# Patient Record
Sex: Female | Born: 1970 | Race: White | Hispanic: No | Marital: Married | Smoking: Never smoker
Health system: Southern US, Community
[De-identification: ages and names within clinical notes are randomized; demographics above are authoritative.]

## PROBLEM LIST (undated history)

## (undated) DIAGNOSIS — F329 Major depressive disorder, single episode, unspecified: Secondary | ICD-10-CM

## (undated) DIAGNOSIS — F32A Depression, unspecified: Secondary | ICD-10-CM

## (undated) DIAGNOSIS — I5181 Takotsubo syndrome: Secondary | ICD-10-CM

## (undated) DIAGNOSIS — F419 Anxiety disorder, unspecified: Secondary | ICD-10-CM

## (undated) HISTORY — PX: DILATION AND CURETTAGE OF UTERUS: SHX78

## (undated) HISTORY — PX: CARDIAC SURGERY: SHX584

---

## 2009-05-16 ENCOUNTER — Ambulatory Visit: Payer: Self-pay | Admitting: Family Medicine

## 2009-05-16 DIAGNOSIS — F329 Major depressive disorder, single episode, unspecified: Secondary | ICD-10-CM

## 2012-01-12 ENCOUNTER — Emergency Department (INDEPENDENT_AMBULATORY_CARE_PROVIDER_SITE_OTHER)
Admission: EM | Admit: 2012-01-12 | Discharge: 2012-01-12 | Disposition: A | Discharge status: Home / Self Care | Payer: Managed Care, Other (non HMO) | Source: Home / Self Care | Attending: Family Medicine | Admitting: Family Medicine

## 2012-01-12 DIAGNOSIS — H6120 Impacted cerumen, unspecified ear: Secondary | ICD-10-CM

## 2012-01-12 DIAGNOSIS — H6123 Impacted cerumen, bilateral: Secondary | ICD-10-CM

## 2012-01-12 DIAGNOSIS — J310 Chronic rhinitis: Secondary | ICD-10-CM

## 2012-01-12 HISTORY — DX: Major depressive disorder, single episode, unspecified: F32.9

## 2012-01-12 HISTORY — DX: Depression, unspecified: F32.A

## 2012-01-12 MED ORDER — CEFDINIR 300 MG PO CAPS: 300.0000 mg | ORAL_CAPSULE | Freq: Two times a day (BID) | ORAL | Status: AC

## 2012-01-12 NOTE — Discharge Instructions (Signed)
Take plain Mucinex (guaifenesin) twice daily for cough and congestion.  Add Sudafed in mornings. Increase fluid intake. May use Afrin nasal spray (or generic oxymetazoline) twice daily for about 5 days.  Also recommend using saline nasal spray several times daily and saline nasal irrigation (AYR is a common brand).  Use prescription nose spray after Afrin and saline irrigation Stop all antihistamines for now, and other non-prescription cough/cold preparations. Begin Cefdinir (Omnicef) if not improving about 5 days or if persistent fever develops.

## 2012-01-12 NOTE — ED Notes (Signed)
Patient complains of bilateral ear pain and itching in ears for a couple of weeks. For the last few days she has had sneezing, pressure in her face and congestion. Denies fever, chills, sweats, nausea, vomiting or diarrhea.

## 2012-01-12 NOTE — ED Provider Notes (Signed)
History     CSN: [id number]  Arrival date & time 01/12/12  1621   First MD Initiated Contact with Patient 01/12/12 1713      Chief Complaint  Patient presents with  . Otalgia    couple weeks  . Nasal Congestion    few days      HPI Comments: Patient complains of bilateral mild earache and sensation of ears being clogged for about one week.  Both ears have been itching.  She has had increased sinus congestion and sinus pressure for about 3 to 4 days.  She has had no improvement with Sudafed and Claritin.  No sore throat or cough.  No fever.  The history is provided by the patient.    Past Medical History  Diagnosis Date  . Depression     Past Surgical History  Procedure Date  . Dilation and curettage of uterus     1996    Family History  Problem Relation Age of Onset  . Cancer Mother     lung  . Hypertension Father   . Asthma Brother   . Heart failure Other     History  Substance Use Topics  . Smoking status: Never Smoker   . Smokeless tobacco: Never Used  . Alcohol Use: 1.2 oz/week    2 Glasses of wine per week    OB History    Grav Para Term Preterm Abortions TAB SAB Ect Mult Living                  Review of Systems No sore throat No cough No pleuritic pain No wheezing + nasal congestion + post-nasal drainage + sinus pain/pressure No itchy/red eyes + earache No hemoptysis No SOB No fever/ ? chills No nausea No vomiting No abdominal pain No diarrhea No urinary symptoms No skin rashes + fatigue No myalgias + headache Used OTC meds without relief  Allergies  Penicillins  Home Medications   Current Outpatient Rx  Name Route Sig Dispense Refill  . ESCITALOPRAM OXALATE 10 MG PO TABS Oral Take 10 mg by mouth daily.    Marland Kitchen CEFDINIR 300 MG PO CAPS Oral Take 1 capsule (300 mg total) by mouth 2 (two) times daily. (Rx void after 01/20/12) 20 capsule 0    BP 121/66  Pulse 64  Temp 98.4 F (36.9 C) (Oral)  Resp 18  Ht 5\' 4"  (1.626 m)   Wt 115 lb (52.164 kg)  BMI 19.74 kg/m2  SpO2 100%  LMP 12/29/2011  Physical Exam Nursing notes and Vital Signs reviewed. Appearance:  Patient appears healthy, stated age, and in no acute distress Eyes:  Pupils are equal, round, and reactive to light and accomodation.  Extraocular movement is intact.  Conjunctivae are not inflamed  Ears:  Canals are occluded with cerumen bilaterally. Nose:  Mildly congested turbinates.  Mild maxillary sinus tenderness is present.  Pharynx:  Normal Neck:  Supple.  Slightly tender shotty posterior nodes are palpated bilaterally  Lungs:  Clear to auscultation.  Breath sounds are equal.  Heart:  Regular rate and rhythm without murmurs, rubs, or gallops.  Abdomen:  Nontender without masses or hepatosplenomegaly.  Bowel sounds are present.  No CVA or flank tenderness.  Extremities:  No edema.  No calf tenderness Skin:  No rash present.   ED Course  Procedures Bilateral ear lavage.  Post lavage, both canals and tympanic membranes appear normal.      1. Bilateral impacted cerumen   2. Rhinitis; possibly an  early viral URI       MDM  There is no evidence of bacterial infection today.   Treat symptomatically for now: Take plain Mucinex (guaifenesin) twice daily for cough and congestion.  Add Sudafed in mornings. Increase fluid intake. May use Afrin nasal spray (or generic oxymetazoline) twice daily for about 5 days.  Also recommend using saline nasal spray several times daily and saline nasal irrigation (AYR is a common brand).  Use prescription nose spray after Afrin and saline irrigation Stop all antihistamines for now, and other non-prescription cough/cold preparations. Begin Cefdinir (Omnicef) if not improving about 5 days or if persistent fever develops (Given a prescription to hold, with an expiration date)  Followup with ENT if not improving.        Lattie Haw, MD 01/12/12 [phone]

## 2012-07-23 ENCOUNTER — Encounter: Payer: Self-pay | Admitting: Emergency Medicine

## 2012-07-23 ENCOUNTER — Emergency Department (INDEPENDENT_AMBULATORY_CARE_PROVIDER_SITE_OTHER)
Admission: EM | Admit: 2012-07-23 | Discharge: 2012-07-23 | Disposition: A | Discharge status: Home / Self Care | Payer: Managed Care, Other (non HMO) | Source: Home / Self Care | Attending: Family Medicine | Admitting: Family Medicine

## 2012-07-23 DIAGNOSIS — J069 Acute upper respiratory infection, unspecified: Secondary | ICD-10-CM

## 2012-07-23 MED ORDER — BENZONATATE 200 MG PO CAPS: 200.0000 mg | ORAL_CAPSULE | Freq: Every day | ORAL | Status: DC

## 2012-07-23 MED ORDER — DOXYCYCLINE HYCLATE 100 MG PO CAPS: 100.0000 mg | ORAL_CAPSULE | Freq: Two times a day (BID) | ORAL | Status: DC

## 2012-07-23 NOTE — ED Notes (Signed)
Reports cough x 3 days leading to raw throat, aches, low grade fever, congestion; no OTC since last night. Did have Flu vaccination this season.

## 2012-07-23 NOTE — ED Provider Notes (Signed)
History     CSN: [id number]  Arrival date & time 07/23/12  1101   First MD Initiated Contact with Patient 07/23/12 1121      Chief Complaint  Patient presents with  . Cough  . Headache  . Fever     HPI Comments: Patient complains of approximately 3 day history of gradually progressive URI symptoms beginning with a non-productive cough, mild sore throat (now improved), followed by nasal congestion. Complains of fatigue and initial myalgias.  Cough is now worse at night and generally non-productive during the day.  There has been no pleuritic pain, shortness of breath, or wheezes.   The history is provided by the patient.    Past Medical History  Diagnosis Date  . Depression     Past Surgical History  Procedure Date  . Dilation and curettage of uterus     1996    Family History  Problem Relation Age of Onset  . Cancer Mother     lung  . Hypertension Father   . Asthma Brother   . Heart failure Other     History  Substance Use Topics  . Smoking status: Never Smoker   . Smokeless tobacco: Never Used  . Alcohol Use: 1.2 oz/week    2 Glasses of wine per week    OB History    Grav Para Term Preterm Abortions TAB SAB Ect Mult Living                  Review of Systems + mild sore throat + cough No pleuritic pain No wheezing + nasal congestion + post-nasal drainage No sinus pain/pressure No itchy/red eyes No earache No hemoptysis No SOB + low grade fever, + chills No nausea No vomiting No abdominal pain No diarrhea No urinary symptoms No skin rashes + fatigue + myalgias + headache Used OTC meds without relief  Allergies  Penicillins  Home Medications   Current Outpatient Rx  Name  Route  Sig  Dispense  Refill  . BENZONATATE 200 MG PO CAPS   Oral   Take 1 capsule (200 mg total) by mouth at bedtime. Take as needed for cough   12 capsule   0   . DOXYCYCLINE HYCLATE 100 MG PO CAPS   Oral   Take 1 capsule (100 mg total) by mouth 2 (two)  times daily. (Rx void after 07/31/12)   20 capsule   0   . ESCITALOPRAM OXALATE 10 MG PO TABS   Oral   Take 10 mg by mouth daily.           BP 124/76  Temp 98.8 F (37.1 C) (Oral)  Resp 16  Ht 5\' 7"  (1.702 m)  Wt 117 lb (53.071 kg)  BMI 18.32 kg/m2  SpO2 100%  LMP 07/22/2012  Physical Exam Nursing notes and Vital Signs reviewed. Appearance:  Patient appears healthy, stated age, and in no acute distress Eyes:  Pupils are equal, round, and reactive to light and accomodation.  Extraocular movement is intact.  Conjunctivae are not inflamed  Ears:  Canals normal.  Tympanic membranes normal.  Nose:  Mildly congested turbinates.  No sinus tenderness.    Pharynx:  Normal Neck:  Supple.  Slightly tender shotty posterior nodes are palpated bilaterally  Lungs:  Clear to auscultation.  Breath sounds are equal.  Heart:  Regular rate and rhythm without murmurs, rubs, or gallops.  Abdomen:  Nontender without masses or hepatosplenomegaly.  Bowel sounds are present.  No CVA or  flank tenderness.  Extremities:  No edema.  No calf tenderness Skin:  No rash present.   ED Course  Procedures  none      1. Acute upper respiratory infections of unspecified site       MDM  There is no evidence of bacterial infection today.   Treat symptomatically for now  Prescription written for Benzonatate (Tessalon) to take at bedtime for night-time cough.  Take plain Mucinex (guaifenesin) twice daily for cough and congestion.  May add Sudafed as needed.   Increase fluid intake, rest. May use Afrin nasal spray (or generic oxymetazoline) twice daily for about 5 days.  Also recommend using saline nasal spray several times daily and saline nasal irrigation (AYR is a common brand) Stop all antihistamines for now, and other non-prescription cough/cold preparations. May take Ibuprofen 200mg , 4 tabs every 8 hours with food for body aches, fever, etc. Begin doxycycline if not improving about one week or if  persistent fever develops (Given a prescription to hold, with an expiration date)  Follow-up with family doctor if not improving10 days.         Lattie Haw, MD 07/23/12 [phone]

## 2012-07-24 ENCOUNTER — Telehealth: Payer: Self-pay | Admitting: *Deleted

## 2013-02-10 ENCOUNTER — Emergency Department (INDEPENDENT_AMBULATORY_CARE_PROVIDER_SITE_OTHER)
Admission: EM | Admit: 2013-02-10 | Discharge: 2013-02-10 | Disposition: A | Discharge status: Home / Self Care | Payer: Managed Care, Other (non HMO) | Source: Home / Self Care | Attending: Family Medicine | Admitting: Family Medicine

## 2013-02-10 DIAGNOSIS — J069 Acute upper respiratory infection, unspecified: Secondary | ICD-10-CM

## 2013-02-10 DIAGNOSIS — J40 Bronchitis, not specified as acute or chronic: Secondary | ICD-10-CM

## 2013-02-10 HISTORY — DX: Anxiety disorder, unspecified: F41.9

## 2013-02-10 MED ORDER — HYDROCOD POLST-CHLORPHEN POLST 10-8 MG/5ML PO LQCR: 5.0000 mL | Freq: Two times a day (BID) | ORAL | Status: DC | PRN

## 2013-02-10 MED ORDER — AZITHROMYCIN 250 MG PO TABS: ORAL_TABLET | ORAL | Status: DC

## 2013-02-10 NOTE — ED Provider Notes (Signed)
History    CSN: [id number] Arrival date & time 02/10/13  1244  First MD Initiated Contact with Patient 02/10/13 1246     Chief Complaint  Patient presents with  . Facial Pain  . Nasal Congestion    HPI  URI Symptoms Onset: 2 weeks  Description: rhinorrhea, nasal congestion, cough Modifying factors:  non  Symptoms Nasal discharge: yes Fever: no Sore throat: no Cough: yes Wheezing: no Ear pain: no GI symptoms: no Sick contacts: yes  Red Flags  Stiff neck: no Dyspnea: no Rash: no Swallowing difficulty: no  Sinusitis Risk Factors Headache/face pain: n Double sickening: no tooth pain: no  Allergy Risk Factors Sneezing: no Itchy scratchy throat: no Seasonal symptoms: yes  Flu Risk Factors Headache: no muscle aches: no severe fatigue: no   Past Medical History  Diagnosis Date  . Depression   . Anxiety    Past Surgical History  Procedure Laterality Date  . Dilation and curettage of uterus      1996   Family History  Problem Relation Age of Onset  . Cancer Mother     lung  . Hypertension Father   . Asthma Brother   . Heart failure Other    History  Substance Use Topics  . Smoking status: Never Smoker   . Smokeless tobacco: Never Used  . Alcohol Use: 1.2 oz/week    2 Glasses of wine per week   OB History   Grav Para Term Preterm Abortions TAB SAB Ect Mult Living                 Review of Systems  All other systems reviewed and are negative.    Allergies  Penicillins  Home Medications   Current Outpatient Rx  Name  Route  Sig  Dispense  Refill  . azithromycin (ZITHROMAX) 250 MG tablet      Take 2 tabs PO x 1 dose, then 1 tab PO QD x 4 days   6 tablet   0   . benzonatate (TESSALON) 200 MG capsule   Oral   Take 1 capsule (200 mg total) by mouth at bedtime. Take as needed for cough   12 capsule   0   . chlorpheniramine-HYDROcodone (TUSSIONEX PENNKINETIC ER) 10-8 MG/5ML LQCR   Oral   Take 5 mLs by mouth every 12 (twelve)  hours as needed (cough).   60 mL   0   . doxycycline (VIBRAMYCIN) 100 MG capsule   Oral   Take 1 capsule (100 mg total) by mouth 2 (two) times daily. (Rx void after 07/31/12)   20 capsule   0   . escitalopram (LEXAPRO) 10 MG tablet   Oral   Take 10 mg by mouth daily.          BP 121/77  Pulse 72  Temp(Src) 98.2 F (36.8 C) (Oral)  Ht 5\' 7"  (1.702 m)  Wt 117 lb (53.071 kg)  BMI 18.32 kg/m2  SpO2 100%  LMP 02/09/2013 Physical Exam  Constitutional: She appears well-developed and well-nourished.  HENT:  Head: Normocephalic and atraumatic.  Right Ear: External ear normal.  Left Ear: External ear normal.  +nasal erythema, rhinorrhea bilaterally, + post oropharyngeal erythema    Eyes: Conjunctivae are normal. Pupils are equal, round, and reactive to light.  Neck: Normal range of motion. Neck supple.  Cardiovascular: Normal rate and regular rhythm.   Pulmonary/Chest: Effort normal and breath sounds normal. She has no wheezes. She has no rales.  Abdominal: Soft.  Musculoskeletal: Normal range of motion.  Neurological: She is alert.  Skin: Skin is warm.    ED Course  Procedures (including critical care time) Labs Reviewed - No data to display No results found. 1. Bronchitis   2. URI (upper respiratory infection)     MDM  Will place on zpak for atypical coverage  Discussed supportive care and infectious/resp red flags.  Follow up as needed.     The patient and/or caregiver has been counseled thoroughly with regard to treatment plan and/or medications prescribed including dosage, schedule, interactions, rationale for use, and possible side effects and they verbalize understanding. Diagnoses and expected course of recovery discussed and will return if not improved as expected or if the condition worsens. Patient and/or caregiver verbalized understanding.       Doree Albee, MD 02/10/13 1312

## 2013-02-10 NOTE — ED Notes (Signed)
X 2 weeks noted cough, congestion and headache, w/out relief from OTC meds.

## 2013-03-03 ENCOUNTER — Emergency Department (INDEPENDENT_AMBULATORY_CARE_PROVIDER_SITE_OTHER): Payer: Managed Care, Other (non HMO)

## 2013-03-03 ENCOUNTER — Encounter: Payer: Self-pay | Admitting: Emergency Medicine

## 2013-03-03 ENCOUNTER — Emergency Department (INDEPENDENT_AMBULATORY_CARE_PROVIDER_SITE_OTHER)
Admission: EM | Admit: 2013-03-03 | Discharge: 2013-03-03 | Disposition: A | Discharge status: Home / Self Care | Payer: Managed Care, Other (non HMO) | Source: Home / Self Care | Attending: Family Medicine | Admitting: Family Medicine

## 2013-03-03 DIAGNOSIS — R05 Cough: Secondary | ICD-10-CM

## 2013-03-03 DIAGNOSIS — J9801 Acute bronchospasm: Secondary | ICD-10-CM

## 2013-03-03 DIAGNOSIS — R059 Cough, unspecified: Secondary | ICD-10-CM

## 2013-03-03 MED ORDER — PREDNISONE 20 MG PO TABS: 20.0000 mg | ORAL_TABLET | Freq: Two times a day (BID) | ORAL | Status: DC

## 2013-03-03 NOTE — ED Notes (Signed)
Cough, congestion x 1 month

## 2013-03-03 NOTE — ED Provider Notes (Signed)
CSN: [id number]     Arrival date & time 03/03/13  1130 History     First MD Initiated Contact with Patient 03/03/13 1154     Chief Complaint  Patient presents with  . Cough      HPI Comments: Patient had a cold-like URI that began about 5 weeks ago.  She was treated here 3 weeks ago with a Z-pack and generally improved and felt better.  However, she continues to have non-productive cough primarily in the morning and evening.  No pleuritic pain.  No fevers, chills, and sweats. She notes that she has a past history of exercise asthma and has used an albuterol inhaler in the past.  Recently she resumed her inhaler and notes that her cough improves.  She also has a family history of asthma, including her son.  The history is provided by the patient.    Past Medical History  Diagnosis Date  . Depression   . Anxiety    Past Surgical History  Procedure Laterality Date  . Dilation and curettage of uterus      1996   Family History  Problem Relation Age of Onset  . Cancer Mother     lung  . Hypertension Father   . Asthma Brother   . Heart failure Other    History  Substance Use Topics  . Smoking status: Never Smoker   . Smokeless tobacco: Never Used  . Alcohol Use: 1.2 oz/week    2 Glasses of wine per week   OB History   Grav Para Term Preterm Abortions TAB SAB Ect Mult Living                 Review of Systems No sore throat at present + cough No pleuritic pain + occasional wheezing Minimal nasal congestion ? post-nasal drainage No sinus pain/pressure No itchy/red eyes ? earache No hemoptysis No SOB No fever/chills No nausea No vomiting No abdominal pain No diarrhea No urinary symptoms No skin rashes + fatigue No myalgias No headache Used OTC meds without relief  Allergies  Penicillins  Home Medications   Current Outpatient Rx  Name  Route  Sig  Dispense  Refill  . escitalopram (LEXAPRO) 10 MG tablet   Oral   Take 10 mg by mouth daily.         .  predniSONE (DELTASONE) 20 MG tablet   Oral   Take 1 tablet (20 mg total) by mouth 2 (two) times daily.   10 tablet   0    BP 123/71  Pulse 61  Temp(Src) 98.1 F (36.7 C) (Oral)  Ht 5\' 7"  (1.702 m)  Wt 117 lb (53.071 kg)  BMI 18.32 kg/m2  SpO2 99%  LMP 02/09/2013 Physical Exam Nursing notes and Vital Signs reviewed. Appearance:  Patient appears healthy, stated age, and in no acute distress Eyes:  Pupils are equal, round, and reactive to light and accomodation.  Extraocular movement is intact.  Conjunctivae are not inflamed  Ears:  Canals normal.  Tympanic membranes normal.  Nose:  Mildly congested turbinates.  No sinus tenderness.    Pharynx:  Normal Neck:  Supple.  Slightly tender shotty posterior nodes are palpated bilaterally  Lungs:  Clear to auscultation.  Breath sounds are equal.  Heart:  Regular rate and rhythm without murmurs, rubs, or gallops.  Abdomen:  Nontender without masses or hepatosplenomegaly.  Bowel sounds are present.  No CVA or flank tenderness.  Extremities:  No edema.  No calf tenderness Skin:  No rash present.    ED Course   Procedures  none   Dg Chest 2 View  03/03/2013   *RADIOLOGY REPORT*  Clinical Data: Cough  CHEST - 2 VIEW  Comparison: None.  Findings: Lungs are clear. No pleural effusion or pneumothorax.  Cardiomediastinal silhouette is within normal limits.  Visualized osseous structures are within normal limits.  IMPRESSION: No evidence of acute cardiopulmonary disease.   Original Report Authenticated By: Charline Bills, M.D.   1. Post-infection bronchospasm     MDM  Begin prednisone burst. Take plain Mucinex (guaifenesin) twice daily for cough and congestion.  Increase fluid intake, rest. Stop all antihistamines for now, and other non-prescription cough/cold preparations. Continue albuterol inhaler as needed. May take Delsym Cough Suppressant at bedtime for nighttime cough.  Suggested that patient visit her PCP to establish an asthma  plan. Followup with Family Doctor if not improved in 7 to 10 days.  Lattie Haw, MD 03/03/13 1302

## 2013-08-06 ENCOUNTER — Emergency Department (INDEPENDENT_AMBULATORY_CARE_PROVIDER_SITE_OTHER)
Admission: EM | Admit: 2013-08-06 | Discharge: 2013-08-06 | Disposition: A | Discharge status: Home / Self Care | Payer: Managed Care, Other (non HMO) | Source: Home / Self Care

## 2013-08-06 ENCOUNTER — Encounter: Payer: Self-pay | Admitting: Emergency Medicine

## 2013-08-06 DIAGNOSIS — J3489 Other specified disorders of nose and nasal sinuses: Secondary | ICD-10-CM

## 2013-08-06 DIAGNOSIS — R0981 Nasal congestion: Secondary | ICD-10-CM

## 2013-08-06 MED ORDER — CEFDINIR 300 MG PO CAPS: 300.0000 mg | ORAL_CAPSULE | Freq: Two times a day (BID) | ORAL | Status: DC

## 2013-08-06 MED ORDER — PREDNISONE 20 MG PO TABS: 20.0000 mg | ORAL_TABLET | Freq: Two times a day (BID) | ORAL | Status: DC

## 2013-08-06 MED ORDER — FLUTICASONE PROPIONATE 50 MCG/ACT NA SUSP: NASAL | Status: DC

## 2013-08-06 NOTE — Discharge Instructions (Signed)
Take Mucinex D (1200mg  guaifenesin with decongestant) twice daily for congestion.  Increase fluid intake, rest. May use Afrin nasal spray (or generic oxymetazoline) twice daily for about 5 days.  Also recommend using saline nasal spray several times daily and saline nasal irrigation (AYR is a common brand).  Use Flonase spray after using Afrin and saline rinse. Begin Cefdinir if not improving about one week or if persistent fever develops   Follow-up with family doctor if not improving about10 days.

## 2013-08-06 NOTE — ED Provider Notes (Signed)
CSN: [id number]     Arrival date & time 08/06/13  1914 History   None    Chief Complaint  Patient presents with  . Headache      HPI Comments: Patient complains of sinus congestion and "sinus headache" for five days.  No sore throat, cough, or URI symptoms.  No fevers, chills, and sweats.  No neurologic symptoms   The history is provided by the patient.    Past Medical History  Diagnosis Date  . Depression   . Anxiety    Past Surgical History  Procedure Laterality Date  . Dilation and curettage of uterus      1996   Family History  Problem Relation Age of Onset  . Cancer Mother     lung  . Hypertension Father   . Asthma Brother   . Heart failure Other    History  Substance Use Topics  . Smoking status: Never Smoker   . Smokeless tobacco: Never Used  . Alcohol Use: 1.2 oz/week    2 Glasses of wine per week   OB History   Grav Para Term Preterm Abortions TAB SAB Ect Mult Living                 Review of Systems No sore throat No cough No pleuritic pain No wheezing + nasal congestion + post-nasal drainage + sinus pain/pressure No itchy/red eyes No earache No hemoptysis No SOB No fever/chills No nausea No vomiting No abdominal pain No diarrhea No urinary symptoms No skin rash No fatigue No myalgias + headache Used OTC meds without relief  Allergies  Penicillins  Home Medications   Current Outpatient Rx  Name  Route  Sig  Dispense  Refill  . cefdinir (OMNICEF) 300 MG capsule   Oral   Take 1 capsule (300 mg total) by mouth 2 (two) times daily. (Rx void after 08/14/13)   14 capsule   0   . escitalopram (LEXAPRO) 10 MG tablet   Oral   Take 5 mg by mouth daily.          . fluticasone (FLONASE) 50 MCG/ACT nasal spray      Place two sprays in each nostril once daily   16 g   1   . predniSONE (DELTASONE) 20 MG tablet   Oral   Take 1 tablet (20 mg total) by mouth 2 (two) times daily.   10 tablet   0    BP 123/75  Pulse 71  Temp(Src)  98.3 F (36.8 C) (Oral)  Resp 16  Ht 5\' 7"  (1.702 m)  Wt 121 lb (54.885 kg)  BMI 18.95 kg/m2  SpO2 100%  LMP 07/27/2013 Physical Exam Nursing notes and Vital Signs reviewed. Appearance:  Patient appears healthy, stated age, and in no acute distress Eyes:  Pupils are equal, round, and reactive to light and accomodation.  Extraocular movement is intact.  Conjunctivae are not inflamed  Ears:  Canals normal.  Tympanic membranes normal.  Nose:  Mildly congested turbinates   Maxillary sinus tenderness is present.  Pharynx:  Normal Neck:  Supple.  No adenopathy Lungs:  Clear to auscultation.  Breath sounds are equal.  Heart:  Regular rate and rhythm without murmurs, rubs, or gallops.  Abdomen:  Nontender without masses or hepatosplenomegaly.  Bowel sounds are present.  No CVA or flank tenderness.  Extremities:  No edema.  No calf tenderness Skin:  No rash present.   ED Course  Procedures  none  MDM   1. Sinus congestion    Begin prednisone burst.  Begin Flonase. Take Mucinex D (1200mg  guaifenesin with decongestant) twice daily for congestion.  Increase fluid intake, rest. May use Afrin nasal spray (or generic oxymetazoline) twice daily for about 5 days.  Also recommend using saline nasal spray several times daily and saline nasal irrigation (AYR is a common brand).  Use Flonase spray after using Afrin and saline rinse. Begin Cefdinir if not improving about one week or if persistent fever develops (Given a prescription to hold, with an expiration date.  She notes that she has taken Keflex in past without adverse effect)  Follow-up with family doctor if not improving about10 days.    Lattie HawStephen A Tarah Buboltz, MD 08/11/13 [phone]

## 2013-08-06 NOTE — ED Notes (Signed)
Pt c/o sinus HA x 4 days. Denies fever or URI s/s.

## 2013-08-13 ENCOUNTER — Telehealth: Payer: Self-pay | Admitting: Emergency Medicine

## 2013-08-13 NOTE — ED Notes (Signed)
Inquired about patient's status; encourage them to call with questions/concerns.  

## 2014-09-09 ENCOUNTER — Encounter: Payer: Self-pay | Admitting: *Deleted

## 2014-09-09 ENCOUNTER — Emergency Department (INDEPENDENT_AMBULATORY_CARE_PROVIDER_SITE_OTHER)
Admission: EM | Admit: 2014-09-09 | Discharge: 2014-09-09 | Disposition: A | Discharge status: Home / Self Care | Payer: Managed Care, Other (non HMO) | Source: Home / Self Care | Attending: Family Medicine | Admitting: Family Medicine

## 2014-09-09 DIAGNOSIS — J029 Acute pharyngitis, unspecified: Secondary | ICD-10-CM

## 2014-09-09 HISTORY — DX: Takotsubo syndrome: I51.81

## 2014-09-09 LAB — POCT RAPID STREP A (OFFICE): Rapid Strep A Screen: NEGATIVE

## 2014-09-09 MED ORDER — CEFDINIR 300 MG PO CAPS: 300.0000 mg | ORAL_CAPSULE | Freq: Two times a day (BID) | ORAL | Status: DC

## 2014-09-09 MED ORDER — LIDOCAINE VISCOUS 2 % MT SOLN: 15.0000 mL | Freq: Three times a day (TID) | OROMUCOSAL | Status: DC

## 2014-09-09 NOTE — ED Provider Notes (Signed)
CSN: [id number]     Arrival date & time 09/09/14  1614 History   First MD Initiated Contact with Patient 09/09/14 1633     Chief Complaint  Patient presents with  . Sore Throat      HPI Comments: Patient complains of a persistent sore throat for three days without other symptoms.  No fevers, chills, and sweats.  The history is provided by the patient.    Past Medical History  Diagnosis Date  . Depression   . Anxiety   . Takotsubo cardiomyopathy    Past Surgical History  Procedure Laterality Date  . Dilation and curettage of uterus      1996   Family History  Problem Relation Age of Onset  . Cancer Mother     lung  . Hypertension Father   . Asthma Brother   . Heart failure Other    History  Substance Use Topics  . Smoking status: Never Smoker   . Smokeless tobacco: Never Used  . Alcohol Use: 1.2 oz/week    2 Glasses of wine per week   OB History    No data available     Review of Systems + sore throat No cough No pleuritic pain No wheezing + nasal congestion ? post-nasal drainage No sinus pain/pressure No itchy/red eyes No earache No hemoptysis No SOB No fever/chills No nausea No vomiting No abdominal pain No diarrhea No urinary symptoms No skin rash + fatigue No myalgias No headache Used OTC meds without relief  Allergies  Penicillins  Home Medications   Prior to Admission medications   Medication Sig Start Date End Date Taking? Authorizing Provider  aspirin 81 MG tablet Take 81 mg by mouth daily.   Yes Historical Provider, MD  escitalopram (LEXAPRO) 10 MG tablet Take 5 mg by mouth daily.    Yes Historical Provider, MD  lisinopril (PRINIVIL,ZESTRIL) 10 MG tablet Take 10 mg by mouth daily.   Yes Historical Provider, MD  metoprolol tartrate (LOPRESSOR) 25 MG tablet Take 25 mg by mouth 2 (two) times daily.   Yes Historical Provider, MD  cefdinir (OMNICEF) 300 MG capsule Take 1 capsule (300 mg total) by mouth 2 (two) times daily. (Rx void after  08/14/13) 09/09/14   Lattie Haw, MD  fluticasone Aleda Grana) 50 MCG/ACT nasal spray Place two sprays in each nostril once daily 08/06/13   Lattie Haw, MD  lidocaine (XYLOCAINE) 2 % solution Use as directed 15 mLs in the mouth or throat 4 (four) times daily -  before meals and at bedtime. Gargle, then expectorate 09/09/14   Lattie Haw, MD   BP 104/68 mmHg  Pulse 65  Temp(Src) 98.5 F (36.9 C) (Oral)  Resp 16  Ht  (1.702 m)  Wt 118 lb (53.524 kg)  BMI 18.48 kg/m2  SpO2 99%  LMP 09/06/2014 Physical Exam Nursing notes and Vital Signs reviewed. Appearance:  Patient appears stated age, and in no acute distress Eyes:  Pupils are equal, round, and reactive to light and accomodation.  Extraocular movement is intact.  Conjunctivae are not inflamed  Ears:  Canals normal.  Tympanic membranes normal.  Nose:  Mildly congested turbinates.  No sinus tenderness.   Pharynx:  Marked erythema posteriorly Neck:  Supple.  Tender shotty anterior nodes only Lungs:  Clear to auscultation.  Breath sounds are equal.  Heart:  Regular rate and rhythm without murmurs, rubs, or gallops.  Abdomen:  Nontender without masses or hepatosplenomegaly.  Bowel sounds are present.  No CVA or flank tenderness.  Extremities:  No edema.  No calf tenderness Skin:  No rash present.   ED Course  Procedures  None    Labs Reviewed  STREP A DNA PROBE  POCT RAPID STREP A (OFFICE) negative      MDM   1. Acute pharyngitis, unspecified pharyngitis type; ?false negative strep   Throat culture pending. Begin Omnicef.  Lidocaine viscous gargle for pain control Try warm salt water gargles several times daily. Followup with Family Doctor if not improved in one week.     Lattie HawStephen A Beese, MD 09/14/14 [phone]

## 2014-09-09 NOTE — Discharge Instructions (Signed)
Try warm salt water gargles several times daily.   Salt Water Gargle This solution will help make your mouth and throat feel better. HOME CARE INSTRUCTIONS   Mix 1 teaspoon of salt in 8 ounces of warm water.  Gargle with this solution as much or often as you need or as directed. Swish and gargle gently if you have any sores or wounds in your mouth.  Do not swallow this mixture. Document Released: 04/22/2004 Document Revised: 10/11/2011 Document Reviewed: 09/13/2008 [facility] Patient Information 2015 [place], [place]. This information is not intended to replace advice given to you by your health care provider. Make sure you discuss any questions you have with your health care provider.

## 2014-09-09 NOTE — ED Notes (Signed)
Pt c/o sore throat x 3 days. Denies fever.  

## 2014-09-10 LAB — STREP A DNA PROBE: GASP: NEGATIVE

## 2016-08-13 ENCOUNTER — Emergency Department (INDEPENDENT_AMBULATORY_CARE_PROVIDER_SITE_OTHER)
Admission: EM | Admit: 2016-08-13 | Discharge: 2016-08-13 | Disposition: A | Discharge status: Home / Self Care | Payer: Managed Care, Other (non HMO) | Source: Home / Self Care | Attending: Family Medicine | Admitting: Family Medicine

## 2016-08-13 DIAGNOSIS — L298 Other pruritus: Secondary | ICD-10-CM

## 2016-08-13 MED ORDER — TRIAMCINOLONE ACETONIDE 0.1 % EX CREA: 1.0000 "application " | TOPICAL_CREAM | Freq: Two times a day (BID) | CUTANEOUS | 0 refills | Status: DC

## 2016-08-13 MED ORDER — PREDNISONE 20 MG PO TABS: ORAL_TABLET | ORAL | 0 refills | Status: DC

## 2016-08-13 NOTE — ED Provider Notes (Signed)
CSN: [id number]     Arrival date & time 08/13/16  1922 History   First MD Initiated Contact with Patient 08/13/16 1933     Chief Complaint  Patient presents with  . Rash    Back of knees   (Consider location/radiation/quality/duration/timing/severity/associated sxs/prior Treatment) HPI  Kiara Hubbard is a 46 y.o. female presenting to UC with c/o erythematous moderately pruritic rash on the back of her knees for about 2 weeks.   She has tried OTC cortisone cream w/o relief. Pt notes she does have dried itchy skin on her lower legs but the rash behind her legs is most severe. She reports taking cefdinir that was prescribed previously for a URI "just in case" but stopped taking the antibiotic as she thought that was causing the rash. Denies rash on arms, trunk, back or face. Denies oral swelling. Denies new soaps, lotions, medications, clothes, or detergents. No hx of similar rash.    Past Medical History:  Diagnosis Date  . Anxiety   . Depression   . Takotsubo cardiomyopathy    Past Surgical History:  Procedure Laterality Date  . DILATION AND CURETTAGE OF UTERUS     1996   Family History  Problem Relation Age of Onset  . Cancer Mother     lung  . Hypertension Father   . Asthma Brother   . Heart failure Other    Social History  Substance Use Topics  . Smoking status: Never Smoker  . Smokeless tobacco: Never Used  . Alcohol use 1.2 oz/week    2 Glasses of wine per week   OB History    No data available     Review of Systems  Musculoskeletal: Negative for arthralgias and myalgias.  Skin: Positive for color change and rash. Negative for wound.    Allergies  Penicillins  Home Medications   Prior to Admission medications   Medication Sig Start Date End Date Taking? Authorizing Provider  aspirin 81 MG tablet Take 81 mg by mouth daily.    Historical Provider, MD  cefdinir (OMNICEF) 300 MG capsule Take 1 capsule (300 mg total) by mouth 2 (two) times daily. (Rx void  after 08/14/13) 09/09/14   Lattie Haw, MD  escitalopram (LEXAPRO) 10 MG tablet Take 5 mg by mouth daily.     Historical Provider, MD  fluticasone Aleda Grana) 50 MCG/ACT nasal spray Place two sprays in each nostril once daily 08/06/13   Lattie Haw, MD  lidocaine (XYLOCAINE) 2 % solution Use as directed 15 mLs in the mouth or throat 4 (four) times daily -  before meals and at bedtime. Gargle, then expectorate 09/09/14   Lattie Haw, MD  lisinopril (PRINIVIL,ZESTRIL) 10 MG tablet Take 10 mg by mouth daily.    Historical Provider, MD  metoprolol tartrate (LOPRESSOR) 25 MG tablet Take 25 mg by mouth 2 (two) times daily.    Historical Provider, MD  predniSONE (DELTASONE) 20 MG tablet 3 tabs po day one, then 2 po daily x 4 days 08/13/16   Junius Finner, PA-C  triamcinolone cream (KENALOG) 0.1 % Apply 1 application topically 2 (two) times daily. 08/13/16   Junius Finner, PA-C   Meds Ordered and Administered this Visit  Medications - No data to display  BP 131/74 (BP Location: Left Arm)   Temp 98.4 F (36.9 C) (Oral)   Ht 5\' 7"  (1.702 m)   Wt 117 lb 1.9 oz (53.1 kg)   LMP 07/23/2016   SpO2 100%   BMI  18.34 kg/m  No data found.   Physical Exam  Constitutional: She is oriented to person, place, and time. She appears well-developed and well-nourished.  HENT:  Head: Normocephalic and atraumatic.  Eyes: EOM are normal.  Neck: Normal range of motion.  Cardiovascular: Normal rate.   Pulmonary/Chest: Effort normal.  Musculoskeletal: Normal range of motion.  Neurological: She is alert and oriented to person, place, and time.  Skin: Skin is warm and dry. Rash noted. There is erythema.  Bilateral posterior knees: 3-4cm area of erythematous macular raised rash. Rash does blanch. Non-tender. No bleeding or drainage.   Psychiatric: She has a normal mood and affect. Her behavior is normal.  Nursing note and vitals reviewed.   Urgent Care Course   Clinical Course     Procedures (including  critical care time)  Labs Review Labs Reviewed - No data to display  Imaging Review No results found.   MDM   1. Pruritic erythematous rash    Rash c/w contact dermatitis.  Rx: prednisone and triamcinolone cream Advised to take a non-drowsy antihistamine during the day. She may take OTC benadryl up to 50mg  in the evening to help with itching at night. Cautioned not to drive or take other sedating medications while taking benadryl  F/u with PCP in 1 week if not improving. Patient verbalized understanding and agreement with treatment plan.     Junius Finnerrin O'Malley, PA-C 08/13/16 1946

## 2016-08-13 NOTE — Discharge Instructions (Signed)
°  For itching during the day you may use the cream as well as take non-drowsy antihistamine such as Claritin, Zyrtec or Allegra (generic forms are okay too).  In the evening you may have up to 50mg  benadryl (diphenhydramine) for itching.  This will make you drowsy. Do not drink alcohol, drive, or take other sedating medications such as nyquil while taking benadryl.

## 2016-08-13 NOTE — ED Triage Notes (Signed)
Has had rash on back of knees for about 2 weeks.  Has become worse last several days.

## 2016-08-15 ENCOUNTER — Telehealth: Payer: Self-pay | Admitting: Emergency Medicine

## 2016-08-15 NOTE — Telephone Encounter (Signed)
LM to see how she was doing, if doing well, disregard call, otherwise any questions or concerns, feel free to give the office a call.  TMartin,CMA

## 2016-11-16 ENCOUNTER — Encounter: Payer: Self-pay | Admitting: *Deleted

## 2016-11-16 ENCOUNTER — Emergency Department (INDEPENDENT_AMBULATORY_CARE_PROVIDER_SITE_OTHER)
Admission: EM | Admit: 2016-11-16 | Discharge: 2016-11-16 | Disposition: A | Discharge status: Home / Self Care | Payer: Managed Care, Other (non HMO) | Source: Home / Self Care | Attending: Emergency Medicine | Admitting: Emergency Medicine

## 2016-11-16 DIAGNOSIS — H60591 Other noninfective acute otitis externa, right ear: Secondary | ICD-10-CM | POA: Diagnosis not present

## 2016-11-16 DIAGNOSIS — H6121 Impacted cerumen, right ear: Secondary | ICD-10-CM

## 2016-11-16 MED ORDER — NEOMYCIN-POLYMYXIN-HC 3.5-10000-1 OT SUSP: 4.0000 [drp] | Freq: Three times a day (TID) | OTIC | 1 refills | Status: DC

## 2016-11-16 NOTE — ED Triage Notes (Signed)
Patient c/o right ear fullness x 2-3 weeks. Tinnitus in the right ear x 3 days.

## 2016-11-16 NOTE — ED Provider Notes (Signed)
[facility]    CSN: [id number] Arrival date & time: 11/16/16  1718     History   Chief Complaint Chief Complaint  Patient presents with  . Ear Fullness    HPI Kiara Hubbard is a 46 y.o. female.   HPI Complains of worsening fullness, ringing, pressure, feels "stopped up" and discomfort in right ear. No radiation.  Started 2-3 weeks ago, much worse for the past 3 days. She tried home remedies without success. Associated symptoms: Denies fever or chills. Past Medical History:  Diagnosis Date  . Anxiety   . Depression   . Takotsubo cardiomyopathy     Patient Active Problem List   Diagnosis Date Noted  . DEPRESSION 05/16/2009    Past Surgical History:  Procedure Laterality Date  . DILATION AND CURETTAGE OF UTERUS     1996    OB History    No data available       Home Medications    Prior to Admission medications   Medication Sig Start Date End Date Taking? Authorizing Provider  clonazePAM (KLONOPIN) 0.5 MG tablet Take 0.5 mg by mouth 2 (two) times daily as needed for anxiety.   Yes Historical Provider, MD  escitalopram (LEXAPRO) 10 MG tablet Take 5 mg by mouth daily.    Yes Historical Provider, MD  lisinopril (PRINIVIL,ZESTRIL) 10 MG tablet Take 10 mg by mouth daily.   Yes Historical Provider, MD  metoprolol tartrate (LOPRESSOR) 25 MG tablet Take 25 mg by mouth 2 (two) times daily.   Yes Historical Provider, MD  aspirin 81 MG tablet Take 81 mg by mouth daily.    Historical Provider, MD  fluticasone Aleda Grana) 50 MCG/ACT nasal spray Place two sprays in each nostril once daily 08/06/13   Lattie Haw, MD  neomycin-polymyxin-hydrocortisone (CORTISPORIN) 3.5-10000-1 otic suspension Place 4 drops into the right ear 3 (three) times daily. 11/16/16   Lajean Manes, MD    Family History Family History  Problem Relation Age of Onset  . Cancer Mother     lung  . Hypertension Father   . Asthma Brother   . Heart failure Other     Social  History Social History  Substance Use Topics  . Smoking status: Never Smoker  . Smokeless tobacco: Never Used  . Alcohol use 1.2 oz/week    2 Glasses of wine per week     Allergies   Penicillins   Review of Systems Review of Systems  All other systems reviewed and are negative.    Physical Exam Triage Vital Signs ED Triage Vitals  Enc Vitals Group     BP 11/16/16 1733 101/60     Pulse Rate 11/16/16 1733 82     Resp --      Temp 11/16/16 1733 98.6 F (37 C)     Temp Source 11/16/16 1733 Oral     SpO2 11/16/16 1733 99 %     Weight 11/16/16 1733 116 lb (52.6 kg)     Height --      Head Circumference --      Peak Flow --      Pain Score 11/16/16 1734 0     Pain Loc --      Pain Edu? --      Excl. in GC? --    No data found.   Updated Vital Signs BP 101/60 (BP Location: Left Arm)   Pulse 82   Temp 98.6 F (37 C) (Oral)   Wt 116 lb (52.6  kg)   LMP 11/02/2016   SpO2 99%   BMI 18.17 kg/m   Visual Acuity Right Eye Distance:   Left Eye Distance:   Bilateral Distance:    Right Eye Near:   Left Eye Near:    Bilateral Near:     Physical Exam  Constitutional: She is oriented to person, place, and time. She appears well-developed and well-nourished. No distress.  HENT:  Head: Normocephalic and atraumatic.  Left Ear: External ear normal.  Nose: Nose normal.  Mouth/Throat: Oropharynx is clear and moist.  Right external ear : Tender to palpation. Dense cerumen impaction. After curettage and irrigation, the impaction was relieved and patient felt much better except some irritation external ear.-I reexamined right external ear, mildly swollen and red and inflamed, but action resolved. Right TM normal.  Eyes: Pupils are equal, round, and reactive to light. No scleral icterus.  Neck: Normal range of motion. Neck supple.  Cardiovascular: Normal rate and regular rhythm.   Pulmonary/Chest: Effort normal.  Abdominal: She exhibits no distension.  Neurological: She  is alert and oriented to person, place, and time. No cranial nerve deficit.  Skin: Skin is warm and dry.  Psychiatric: She has a normal mood and affect. Her behavior is normal.  Vitals reviewed.    UC Treatments / Results  Labs (all labs ordered are listed, but only abnormal results are displayed) Labs Reviewed - No data to display  EKG  EKG Interpretation None       Radiology No results found.  Procedures .Ear Cerumen Removal Date/Time: 11/16/2016 5:42 PM Performed by: Georgina Pillion, DAVID Authorized by: Georgina Pillion, DAVID   Consent:    Consent obtained:  Verbal Universal protocol:    Procedure explained and questions answered to patient or proxy's satisfaction: yes     Patient identity confirmed:  Verbally with patient Procedure details:    Location:  R ear   Procedure type: irrigation   Post-procedure details:    Inspection:  TM intact   Hearing quality:  Improved   Patient tolerance of procedure:  Tolerated well, no immediate complications   (including critical care time)  Medications Ordered in UC Medications - No data to display   Initial Impression / Assessment and Plan / UC Course  I have reviewed the triage vital signs and the nursing notes.  Pertinent labs & imaging results that were available during my care of the patient were reviewed by me and considered in my medical decision making (see chart for details).      Final Clinical Impressions(s) / UC Diagnoses   Final diagnoses:  Impacted cerumen of right ear  Acute irritant otitis externa of right ear  Right ear irrigated and copious amount of wax removed and she felt much better.. Rechecked right ear with mild erythema and inflammation of the right external canal but TM without otherwise normal. Advice given on preventing earwax impactions.   Corticosporin otic prescribed to help with residual inflammation of the right otitis externa.  New Prescriptions Discharge Medication List as of 11/16/2016  5:59  PM    START taking these medications   Details  neomycin-polymyxin-hydrocortisone (CORTISPORIN) 3.5-10000-1 otic suspension Place 4 drops into the right ear 3 (three) times daily., Starting Tue 11/16/2016, Normal       Follow-up with your primary care doctor or ENT prn. Precautions discussed. Red flags discussed. Questions invited and answered. Patient voiced understanding and agreement.    Lajean Manes, MD 11/18/16 [phone]

## 2017-09-03 ENCOUNTER — Encounter: Payer: Self-pay | Admitting: Emergency Medicine

## 2017-09-03 ENCOUNTER — Other Ambulatory Visit: Payer: Self-pay

## 2017-09-03 ENCOUNTER — Emergency Department
Admission: EM | Admit: 2017-09-03 | Discharge: 2017-09-03 | Disposition: A | Discharge status: Home / Self Care | Payer: BLUE CROSS/BLUE SHIELD | Source: Home / Self Care | Attending: Family Medicine | Admitting: Family Medicine

## 2017-09-03 DIAGNOSIS — J111 Influenza due to unidentified influenza virus with other respiratory manifestations: Secondary | ICD-10-CM

## 2017-09-03 DIAGNOSIS — R69 Illness, unspecified: Secondary | ICD-10-CM | POA: Diagnosis not present

## 2017-09-03 MED ORDER — OSELTAMIVIR PHOSPHATE 75 MG PO CAPS: 75.0000 mg | ORAL_CAPSULE | Freq: Two times a day (BID) | ORAL | 0 refills | Status: DC

## 2017-09-03 MED ORDER — PREDNISONE 20 MG PO TABS: ORAL_TABLET | ORAL | 0 refills | Status: DC

## 2017-09-03 MED ORDER — BENZONATATE 200 MG PO CAPS: ORAL_CAPSULE | ORAL | 0 refills | Status: DC

## 2017-09-03 NOTE — Discharge Instructions (Signed)
Continue plain Robitussin, with plenty of water, every four hours for cough and congestion.  May add Pseudoephedrine (30mg , one or two every 4 to 6 hours) for sinus congestion.  Get adequate rest.   May use Afrin nasal spray (or generic oxymetazoline) each morning for about 5 days and then discontinue.  Also recommend using saline nasal spray several times daily and saline nasal irrigation (AYR is a common brand).   Try warm salt water gargles for sore throat.  Stop all antihistamines for now, and other non-prescription cough/cold preparations. May take Delsym Cough Suppressant at bedtime for nighttime cough.

## 2017-09-03 NOTE — ED Provider Notes (Signed)
[facility]    CSN: [id number] Arrival date & time: 09/03/17  1811     History   Chief Complaint Chief Complaint  Patient presents with  . Fever  . Cough  . Headache    HPI Kiara Hubbard is a 47 y.o. female.   Complains of 3 day history flu-like illness including myalgias, nasal congestion, headache, fever/chills, fatigue, and cough.  No sore throat.  Cough is non-productive and somewhat worse at night.  No pleuritic pain or shortness of breath.   She has a past history of reactive airways disease, and family history of asthma.   The history is provided by the patient.    Past Medical History:  Diagnosis Date  . Anxiety   . Depression   . Takotsubo cardiomyopathy     Patient Active Problem List   Diagnosis Date Noted  . DEPRESSION 05/16/2009    Past Surgical History:  Procedure Laterality Date  . DILATION AND CURETTAGE OF UTERUS     1996    OB History    No data available       Home Medications    Prior to Admission medications   Medication Sig Start Date End Date Taking? Authorizing Provider  aspirin 81 MG tablet Take 81 mg by mouth daily.    [provider]  benzonatate (TESSALON) 200 MG capsule Take one cap by mouth at bedtime as needed for cough.  May repeat in 4 to 6 hours 09/03/17   Lattie HawBeese, Stephen A, MD  clonazePAM (KLONOPIN) 0.5 MG tablet Take 0.5 mg by mouth 2 (two) times daily as needed for anxiety.    [provider]  escitalopram (LEXAPRO) 10 MG tablet Take 5 mg by mouth daily.     [provider]  fluticasone Aleda Grana(FLONASE) 50 MCG/ACT nasal spray Place two sprays in each nostril once daily 08/06/13   Lattie HawBeese, Stephen A, MD  lisinopril (PRINIVIL,ZESTRIL) 10 MG tablet Take 10 mg by mouth daily.    [provider]  metoprolol tartrate (LOPRESSOR) 25 MG tablet Take 25 mg by mouth 2 (two) times daily.    [provider]  oseltamivir (TAMIFLU) 75 MG capsule Take 1 capsule (75 mg total) by mouth  every 12 (twelve) hours. 09/03/17   Lattie HawBeese, Stephen A, MD  predniSONE (DELTASONE) 20 MG tablet Take one tab by mouth twice daily for 4 days, then one daily.. Take with food. 09/03/17   Lattie HawBeese, Stephen A, MD    Family History Family History  Problem Relation Age of Onset  . Cancer Mother        lung  . Hypertension Father   . Asthma Brother   . Heart failure Other     Social History Social History   Tobacco Use  . Smoking status: Never Smoker  . Smokeless tobacco: Never Used  Substance Use Topics  . Alcohol use: Yes    Alcohol/week: 1.2 oz    Types: 2 Glasses of wine per week  . Drug use: No     Allergies   Penicillins   Review of Systems Review of Systems No sore throat + cough No pleuritic pain No wheezing + nasal congestion + post-nasal drainage No sinus pain/pressure No itchy/red eyes No earache No hemoptysis No SOB + fever, + chills No nausea No vomiting No abdominal pain No diarrhea No urinary symptoms No skin rash + fatigue + myalgias + headache Used OTC meds without relief   Physical Exam Triage Vital Signs ED Triage Vitals  Enc Vitals Group     BP 09/03/17 1906 113/74     Pulse Rate 09/03/17 1906 99     Resp 09/03/17 1906 18     Temp 09/03/17 1906 98.6 F (37 C)     Temp Source 09/03/17 1906 Oral     SpO2 09/03/17 1906 98 %     Weight 09/03/17 1907 120 lb (54.4 kg)     Height 09/03/17 1907 5\' 7"  (1.702 m)     Head Circumference --      Peak Flow --      Pain Score 09/03/17 1907 2     Pain Loc --      Pain Edu? --      Excl. in GC? --    No data found.  Updated Vital Signs BP 113/74 (BP Location: Right Arm)   Pulse 99   Temp 98.6 F (37 C) (Oral)   Resp 18   Ht 5\' 7"  (1.702 m)   Wt 120 lb (54.4 kg)   LMP 08/20/2017 (Exact Date)   SpO2 98%   BMI 18.79 kg/m   Visual Acuity Right Eye Distance:   Left Eye Distance:   Bilateral Distance:    Right Eye Near:   Left Eye Near:    Bilateral Near:     Physical Exam Nursing  notes and Vital Signs reviewed. Appearance:  Patient appears stated age, and in no acute distress Eyes:  Pupils are equal, round, and reactive to light and accomodation.  Extraocular movement is intact.  Conjunctivae are not inflamed  Ears:  Canals normal.  Tympanic membranes normal.  Nose:  Mildly congested turbinates.  No sinus tenderness. Pharynx:  Normal Neck:  Supple.  Enlarged posterior/lateral nodes are palpated bilaterally, tender to palpation on the left.   Lungs:  Clear to auscultation.  Breath sounds are equal.  Moving air well. Heart:  Regular rate and rhythm without murmurs, rubs, or gallops.  Abdomen:  Nontender without masses or hepatosplenomegaly.  Bowel sounds are present.  No CVA or flank tenderness.  Extremities:  No edema.  Skin:  No rash present.    UC Treatments / Results  Labs (all labs ordered are listed, but only abnormal results are displayed) Labs Reviewed - No data to display  EKG  EKG Interpretation None       Radiology No results found.  Procedures Procedures (including critical care time)  Medications Ordered in UC Medications - No data to display   Initial Impression / Assessment and Plan / UC Course  I have reviewed the triage vital signs and the nursing notes.  Pertinent labs & imaging results that were available during my care of the patient were reviewed by me and considered in my medical decision making (see chart for details).    Begin Tamiflu, and prednisone burst/taper. Prescription written for Benzonatate [facility]) to take at bedtime for night-time cough.  Continue plain Robitussin, with plenty of water, every four hours for cough and congestion.  May add Pseudoephedrine (30mg , one or two every 4 to 6 hours) for sinus congestion.  Get adequate rest.   May use Afrin nasal spray (or generic oxymetazoline) each morning for about 5 days and then discontinue.  Also recommend using saline nasal spray several times daily and saline nasal  irrigation (AYR is a common brand).   Try warm salt water gargles for sore throat.  Stop all antihistamines for now, and other non-prescription cough/cold preparations. May take Delsym Cough Suppressant at bedtime for nighttime  cough.  Followup with Family Doctor if not improved in about 5 days.    Final Clinical Impressions(s) / UC Diagnoses   Final diagnoses:  Influenza-like illness    ED Discharge Orders        Ordered    oseltamivir (TAMIFLU) 75 MG capsule  Every 12 hours     09/03/17 2057    predniSONE (DELTASONE) 20 MG tablet     09/03/17 2057    benzonatate (TESSALON) 200 MG capsule     09/03/17 2057         Lattie Haw, MD 09/06/17 1143

## 2017-09-03 NOTE — ED Triage Notes (Signed)
Patient reports 2 days of fever, cough, headache; temp 100.6 at 1700 and took ibuprofen 400mg  po.

## 2018-04-19 ENCOUNTER — Encounter: Payer: Self-pay | Admitting: *Deleted

## 2018-04-19 ENCOUNTER — Other Ambulatory Visit: Payer: Self-pay

## 2018-04-19 ENCOUNTER — Emergency Department
Admission: EM | Admit: 2018-04-19 | Discharge: 2018-04-19 | Disposition: A | Discharge status: Home / Self Care | Payer: BLUE CROSS/BLUE SHIELD | Source: Home / Self Care | Attending: Family Medicine | Admitting: Family Medicine

## 2018-04-19 DIAGNOSIS — R300 Dysuria: Secondary | ICD-10-CM

## 2018-04-19 LAB — POCT URINALYSIS DIP (MANUAL ENTRY)
Bilirubin, UA: NEGATIVE
GLUCOSE UA: NEGATIVE mg/dL
Ketones, POC UA: NEGATIVE mg/dL
Leukocytes, UA: NEGATIVE
NITRITE UA: NEGATIVE
Protein Ur, POC: NEGATIVE mg/dL
SPEC GRAV UA: 1.01 (ref 1.010–1.025)
UROBILINOGEN UA: 0.2 U/dL
pH, UA: 7 (ref 5.0–8.0)

## 2018-04-19 MED ORDER — NITROFURANTOIN MONOHYD MACRO 100 MG PO CAPS: 100.0000 mg | ORAL_CAPSULE | Freq: Two times a day (BID) | ORAL | 0 refills | Status: DC

## 2018-04-19 NOTE — ED Triage Notes (Signed)
Pt c/o dysuria and vaginal pain x 4 days. Denies fever or chills. No OTC meds.

## 2018-04-19 NOTE — ED Provider Notes (Signed)
[facility]    CSN: [id number] Arrival date & time: 04/19/18  0803     History   Chief Complaint Chief Complaint  Patient presents with  . Dysuria    HPI Kiara Hubbard is a 47 y.o. female.   Patient complains of mild dysuria and vague vaginal discomfort for about 4 to 5 days.  No fevers, chills, and sweats.  No nausea/vomiting.  Patient's last menstrual period was 03/29/2018.  She had mild spotting afterwards, resolved.  No vaginal discharge.  The history is provided by the patient.  Dysuria  Pain quality:  Burning Pain severity:  Mild Onset quality:  Gradual Duration:  4 days Timing:  Constant Progression:  Unchanged Chronicity:  New Recent urinary tract infections: no   Relieved by:  None tried Worsened by:  Nothing Ineffective treatments:  None tried Urinary symptoms: no discolored urine, no foul-smelling urine, no frequent urination, no hematuria, no hesitancy and no bladder incontinence   Associated symptoms: no abdominal pain, no fever, no flank pain, no genital lesions, no nausea, no vaginal discharge and no vomiting   Risk factors: no recurrent urinary tract infections     Past Medical History:  Diagnosis Date  . Anxiety   . Depression   . Takotsubo cardiomyopathy     Patient Active Problem List   Diagnosis Date Noted  . DEPRESSION 05/16/2009    Past Surgical History:  Procedure Laterality Date  . CARDIAC SURGERY    . DILATION AND CURETTAGE OF UTERUS     1996    OB History   None      Home Medications    Prior to Admission medications   Medication Sig Start Date End Date Taking? Authorizing Provider  clonazePAM (KLONOPIN) 0.5 MG tablet Take 0.5 mg by mouth 2 (two) times daily as needed for anxiety.   Yes [provider]  escitalopram (LEXAPRO) 10 MG tablet Take 5 mg by mouth daily.    Yes [provider]  lisinopril (PRINIVIL,ZESTRIL) 10 MG tablet Take 10 mg by mouth daily.   Yes [provider]   metoprolol tartrate (LOPRESSOR) 25 MG tablet Take 25 mg by mouth 2 (two) times daily.   Yes [provider]  aspirin 81 MG tablet Take 81 mg by mouth daily.    [provider]  fluticasone Aleda Grana) 50 MCG/ACT nasal spray Place two sprays in each nostril once daily 08/06/13   Lattie Haw, MD  nitrofurantoin, macrocrystal-monohydrate, (MACROBID) 100 MG capsule Take 1 capsule (100 mg total) by mouth 2 (two) times daily. Take with food. 04/19/18   Lattie Haw, MD    Family History Family History  Problem Relation Age of Onset  . Cancer Mother        lung  . Hypertension Father   . Asthma Brother   . Heart failure Other     Social History Social History   Tobacco Use  . Smoking status: Never Smoker  . Smokeless tobacco: Never Used  Substance Use Topics  . Alcohol use: Yes    Alcohol/week: 2.0 standard drinks    Types: 2 Glasses of wine per week  . Drug use: No     Allergies   Penicillins   Review of Systems Review of Systems  Constitutional: Negative for fever.  Gastrointestinal: Negative for abdominal pain, nausea and vomiting.  Genitourinary: Positive for dysuria. Negative for decreased urine volume, flank pain, frequency, genital sores, hematuria, pelvic pain, urgency, vaginal bleeding and vaginal discharge.  All other systems reviewed and are negative.    Physical Exam Triage Vital Signs ED Triage Vitals  Enc Vitals Group     BP 04/19/18 0828 118/71     Pulse Rate 04/19/18 0828 65     Resp 04/19/18 0828 18     Temp 04/19/18 0828 98 F (36.7 C)     Temp Source 04/19/18 0828 Oral     SpO2 04/19/18 0828 99 %     Weight 04/19/18 0829 116 lb (52.6 kg)     Height 04/19/18 0829 5\' 7"  (1.702 m)     Head Circumference --      Peak Flow --      Pain Score 04/19/18 0828 4     Pain Loc --      Pain Edu? --      Excl. in GC? --    No data found.  Updated Vital Signs BP 118/71 (BP Location: Right Arm)   Pulse 65   Temp 98 F (36.7 C)  (Oral)   Resp 18   Ht 5\' 7"  (1.702 m)   Wt 52.6 kg   LMP 03/29/2018   SpO2 99%   BMI 18.17 kg/m   Visual Acuity Right Eye Distance:   Left Eye Distance:   Bilateral Distance:    Right Eye Near:   Left Eye Near:    Bilateral Near:     Physical Exam Nursing notes and Vital Signs reviewed. Appearance:  Patient appears stated age, and in no acute distress.    Eyes:  Pupils are equal, round, and reactive to light and accomodation.  Extraocular movement is intact.  Conjunctivae are not inflamed   Pharynx:  Normal; moist mucous membranes  Neck:  Supple.  No adenopathy Lungs:  Clear to auscultation.  Breath sounds are equal.  Moving air well. Heart:  Regular rate and rhythm without murmurs, rubs, or gallops.  Abdomen:  Minimal tenderness over bladder and left lower quadrant without masses or hepatosplenomegaly.  Bowel sounds are present.  No CVA or flank tenderness.  Extremities:  No edema.  Skin:  No rash present.     UC Treatments / Results  Labs (all labs ordered are listed, but only abnormal results are displayed) Labs Reviewed  POCT URINALYSIS DIP (MANUAL ENTRY) - Abnormal; Notable for the following components:      Result Value   Blood, UA trace-intact (*)    All other components within normal limits  URINE CULTURE    EKG None  Radiology No results found.  Procedures Procedures (including critical care time)  Medications Ordered in UC Medications - No data to display  Initial Impression / Assessment and Plan / UC Course  I have reviewed the triage vital signs and the nursing notes.  Pertinent labs & imaging results that were available during my care of the patient were reviewed by me and considered in my medical decision making (see chart for details).    Urine culture pending. Begin Macrobid 100mg  BID for one week.   Followup with Family Doctor if not improved in one week.    Final Clinical Impressions(s) / UC Diagnoses   Final diagnoses:  Dysuria      Discharge Instructions     Increase fluid intake. If symptoms become significantly worse during the night or over the weekend, proceed to the local emergency room.     ED Prescriptions    Medication Sig Dispense Auth. Provider   nitrofurantoin, macrocrystal-monohydrate, (MACROBID) 100 MG capsule Take 1 capsule (  100 mg total) by mouth 2 (two) times daily. Take with food. 14 capsule Lattie Haw, MD         Lattie Haw, MD 04/19/18 [phone]

## 2018-04-19 NOTE — Discharge Instructions (Addendum)
Increase fluid intake. °If symptoms become significantly worse during the night or over the weekend, proceed to the local emergency room.  °

## 2018-04-20 LAB — URINE CULTURE
MICRO NUMBER:: 91120270
SPECIMEN QUALITY:: ADEQUATE

## 2018-04-21 ENCOUNTER — Telehealth: Payer: Self-pay | Admitting: *Deleted

## 2018-04-21 NOTE — Telephone Encounter (Signed)
LM with Ucx results, complete ABT if she is feeling better and if she is not feeling better she should f/u with her PCP or GYN for further evaluation. Clemens Catholichristy Dwayne Begay, LPN

## 2020-11-05 ENCOUNTER — Emergency Department
Admission: EM | Admit: 2020-11-05 | Discharge: 2020-11-05 | Disposition: A | Discharge status: Home / Self Care | Payer: 59 | Source: Home / Self Care | Attending: Family Medicine | Admitting: Family Medicine

## 2020-11-05 ENCOUNTER — Other Ambulatory Visit: Payer: Self-pay

## 2020-11-05 DIAGNOSIS — J069 Acute upper respiratory infection, unspecified: Secondary | ICD-10-CM | POA: Diagnosis not present

## 2020-11-05 MED ORDER — ALBUTEROL SULFATE HFA 108 (90 BASE) MCG/ACT IN AERS: 2.0000 | INHALATION_SPRAY | Freq: Four times a day (QID) | RESPIRATORY_TRACT | 0 refills | Status: DC | PRN

## 2020-11-05 MED ORDER — PREDNISONE 20 MG PO TABS: ORAL_TABLET | ORAL | 0 refills | Status: DC

## 2020-11-05 NOTE — Discharge Instructions (Addendum)
Take plain guaifenesin (1200mg  extended release tabs such as Mucinex) twice daily, with plenty of water, for cough and congestion.  May continue Pseudoephedrine (30mg , one or two every 4 to 6 hours) for sinus congestion.  Get adequate rest.   May use Afrin nasal spray (or generic oxymetazoline) each morning for about 5 days and then discontinue.  Also recommend using saline nasal spray several times daily and saline nasal irrigation (AYR is a common brand).  Use Flonase nasal spray each morning after using Afrin nasal spray and saline nasal irrigation. Try warm salt water gargles for sore throat.  Stop all antihistamines for now, and other non-prescription cough/cold preparations. May take Delsym Cough Suppressant ("12 Hour Cough Relief") at bedtime for nighttime cough.   If your COVID-19 test is positive, isolate yourself for five days from the time of your symptom onset.  At the end of five days you may end isolation if your symptoms have cleared or improved, and you have not had a fever for 24 hours. At this time you should wear a mask for five more days when you are around others.   If you are unable to wear a mask at work, you should return to work after 10 days.

## 2020-11-05 NOTE — ED Triage Notes (Signed)
Cough, fever, congestion, sinus pressure, runny nose x 5 days. Vaccinated

## 2020-11-05 NOTE — ED Provider Notes (Signed)
[facility]    CSN: [id number] Arrival date & time: 11/05/20  1660      History   Chief Complaint Chief Complaint  Patient presents with  . Cough    HPI Kiara Hubbard is a 50 y.o. female.   Five days ago patient developed sore throat, fatigue, nasal congestion, and headache.  Two days later she developed a cough, occasional wheezing, and low grade fever to 99.2.  Last night she had fever 100.  She denies pleuritic pain or shortness of breath.  She had a negative home COVID test three days ago.  She has a family history of asthma (brother)  The history is provided by the patient.    Past Medical History:  Diagnosis Date  . Anxiety   . Depression   . Takotsubo cardiomyopathy     Patient Active Problem List   Diagnosis Date Noted  . DEPRESSION 05/16/2009    Past Surgical History:  Procedure Laterality Date  . CARDIAC SURGERY    . DILATION AND CURETTAGE OF UTERUS     1996    OB History   No obstetric history on file.      Home Medications    Prior to Admission medications   Medication Sig Start Date End Date Taking? Authorizing Provider  albuterol (VENTOLIN HFA) 108 (90 Base) MCG/ACT inhaler Inhale 2 puffs into the lungs every 6 (six) hours as needed for wheezing or shortness of breath. 11/05/20  Yes Kiara Haw, MD  predniSONE (DELTASONE) 20 MG tablet Take one tab by mouth twice daily for 4 days, then one daily for 3 days. Take with food. 11/05/20  Yes Kiara Haw, MD  progesterone (ENDOMETRIN) 100 MG vaginal insert Place 100 mg vaginally 2 (two) times daily.   Yes [provider]  clonazePAM (KLONOPIN) 0.5 MG tablet Take 0.5 mg by mouth 2 (two) times daily as needed for anxiety.    [provider]  escitalopram (LEXAPRO) 10 MG tablet Take 5 mg by mouth daily.     [provider]  fluticasone Aleda Grana) 50 MCG/ACT nasal spray Place two sprays in each nostril once daily 08/06/13   Kiara Haw, MD  lisinopril  (PRINIVIL,ZESTRIL) 10 MG tablet Take 10 mg by mouth daily.    [provider]  metoprolol tartrate (LOPRESSOR) 25 MG tablet Take 25 mg by mouth 2 (two) times daily.    [provider]    Family History Family History  Problem Relation Age of Onset  . Cancer Mother        lung  . Hypertension Father   . Asthma Brother   . Heart failure Other     Social History Social History   Tobacco Use  . Smoking status: Never Smoker  . Smokeless tobacco: Never Used  Vaping Use  . Vaping Use: Never used  Substance Use Topics  . Alcohol use: Yes    Alcohol/week: 2.0 standard drinks    Types: 2 Glasses of wine per week  . Drug use: No     Allergies   Penicillins   Review of Systems Review of Systems + sore throat + cough No pleuritic pain ? wheezing + nasal congestion + post-nasal drainage + sinus pain/pressure No itchy/red eyes No earache No hemoptysis No SOB + fever, + chills No nausea No vomiting No abdominal pain No diarrhea No urinary symptoms No skin rash + fatigue No myalgias + headache Used OTC meds (pseudoephedrine, Robitussin, ibuprofen) without relief  Physical Exam Triage Vital Signs ED Triage Vitals  Enc Vitals Group     BP 11/05/20 0914 113/70     Pulse Rate 11/05/20 0914 75     Resp 11/05/20 0914 18     Temp 11/05/20 0914 98.7 F (37.1 C)     Temp Source 11/05/20 0914 Oral     SpO2 11/05/20 0914 97 %     Weight 11/05/20 0915 118 lb (53.5 kg)     Height 11/05/20 0915 5\' 7"  (1.702 m)     Head Circumference --      Peak Flow --      Pain Score 11/05/20 0914 0     Pain Loc --      Pain Edu? --      Excl. in GC? --    No data found.  Updated Vital Signs BP 113/70 (BP Location: Right Arm)   Pulse 75   Temp 98.7 F (37.1 C) (Oral)   Resp 18   Ht 5\' 7"  (1.702 m)   Wt 53.5 kg   SpO2 97%   BMI 18.48 kg/m   Visual Acuity Right Eye Distance:   Left Eye Distance:   Bilateral Distance:    Right Eye Near:   Left Eye  Near:    Bilateral Near:     Physical Exam Nursing notes and Vital Signs reviewed. Appearance:  Patient appears stated age, and in no acute distress Eyes:  Pupils are equal, round, and reactive to light and accomodation.  Extraocular movement is intact.  Conjunctivae are not inflamed  Ears:  Canals normal.  Tympanic membranes normal.  Nose:  Mildly congested turbinates.  No sinus tenderness.  Pharynx:  Normal Neck:  Supple.  Mildly enlarged lateral nodes are present, tender to palpation on the left.   Lungs:  Clear to auscultation.  Breath sounds are equal.  Moving air well. Heart:  Regular rate and rhythm without murmurs, rubs, or gallops.  Abdomen:  Nontender without masses or hepatosplenomegaly.  Bowel sounds are present.  No CVA or flank tenderness.  Extremities:  No edema.  Skin:  No rash present.   UC Treatments / Results  Labs (all labs ordered are listed, but only abnormal results are displayed) Labs Reviewed  NOVEL CORONAVIRUS, NAA    EKG   Radiology No results found.  Procedures Procedures (including critical care time)  Medications Ordered in UC Medications - No data to display  Initial Impression / Assessment and Plan / UC Course  I have reviewed the triage vital signs and the nursing notes.  Pertinent labs & imaging results that were available during my care of the patient were reviewed by me and considered in my medical decision making (see chart for details).    Benign exam.  There is no evidence of bacterial infection today.  Treat symptomatically for now  Suspect mild reactive airways disease. Begin prednisone burst/taper.  Rx for albuterol MDI. COVID19 PCR pending. Followup with Family Doctor if not improved in one week.     Final Clinical Impressions(s) / UC Diagnoses   Final diagnoses:  Viral URI with cough     Discharge Instructions     Take plain guaifenesin (1200mg  extended release tabs such as Mucinex) twice daily, with plenty of water,  for cough and congestion.  May continue Pseudoephedrine (30mg , one or two every 4 to 6 hours) for sinus congestion.  Get adequate rest.   May use Afrin nasal spray (or generic oxymetazoline) each morning for about 5  days and then discontinue.  Also recommend using saline nasal spray several times daily and saline nasal irrigation (AYR is a common brand).  Use Flonase nasal spray each morning after using Afrin nasal spray and saline nasal irrigation. Try warm salt water gargles for sore throat.  Stop all antihistamines for now, and other non-prescription cough/cold preparations. May take Delsym Cough Suppressant ("12 Hour Cough Relief") at bedtime for nighttime cough.   If your COVID-19 test is positive, isolate yourself for five days from the time of your symptom onset.  At the end of five days you may end isolation if your symptoms have cleared or improved, and you have not had a fever for 24 hours. At this time you should wear a mask for five more days when you are around others.   If you are unable to wear a mask at work, you should return to work after 10 days.        ED Prescriptions    Medication Sig Dispense Auth. Provider   predniSONE (DELTASONE) 20 MG tablet Take one tab by mouth twice daily for 4 days, then one daily for 3 days. Take with food. 11 tablet Kiara Haw, MD   albuterol (VENTOLIN HFA) 108 (90 Base) MCG/ACT inhaler Inhale 2 puffs into the lungs every 6 (six) hours as needed for wheezing or shortness of breath. 8 g Kiara Haw, MD        Kiara Haw, MD 11/07/20 [phone]

## 2020-11-06 LAB — SARS-COV-2, NAA 2 DAY TAT

## 2020-11-06 LAB — NOVEL CORONAVIRUS, NAA: SARS-CoV-2, NAA: NOT DETECTED

## 2021-01-07 ENCOUNTER — Other Ambulatory Visit: Payer: Self-pay

## 2021-01-07 ENCOUNTER — Emergency Department (INDEPENDENT_AMBULATORY_CARE_PROVIDER_SITE_OTHER): Payer: 59

## 2021-01-07 ENCOUNTER — Encounter: Payer: Self-pay | Admitting: Emergency Medicine

## 2021-01-07 ENCOUNTER — Emergency Department
Admission: EM | Admit: 2021-01-07 | Discharge: 2021-01-07 | Disposition: A | Discharge status: Home / Self Care | Payer: 59 | Source: Home / Self Care | Attending: Internal Medicine | Admitting: Internal Medicine

## 2021-01-07 DIAGNOSIS — R0789 Other chest pain: Secondary | ICD-10-CM | POA: Diagnosis not present

## 2021-01-07 DIAGNOSIS — R058 Other specified cough: Secondary | ICD-10-CM

## 2021-01-07 DIAGNOSIS — R0602 Shortness of breath: Secondary | ICD-10-CM

## 2021-01-07 MED ORDER — BENZONATATE 100 MG PO CAPS: 100.0000 mg | ORAL_CAPSULE | Freq: Three times a day (TID) | ORAL | 0 refills | Status: DC | PRN

## 2021-01-07 MED ORDER — ALBUTEROL SULFATE HFA 108 (90 BASE) MCG/ACT IN AERS: 2.0000 | INHALATION_SPRAY | Freq: Four times a day (QID) | RESPIRATORY_TRACT | 0 refills | Status: AC | PRN

## 2021-01-07 MED ORDER — FLUTICASONE PROPIONATE 50 MCG/ACT NA SUSP: 1.0000 | Freq: Every day | NASAL | 0 refills | Status: AC

## 2021-01-07 NOTE — ED Provider Notes (Signed)
[facility]    CSN: [id number] Arrival date & time: 01/07/21  1617      History   Chief Complaint Chief Complaint  Patient presents with  . Cough    HPI Kiara Hubbard is a 50 y.o. female fully vaccinated and boosted patient comes to the urgent care with persistent nasal congestion with clear rhinorrhea as well as nonproductive cough of 2 weeks duration.  Patient was diagnosed with COVID-19 infection a couple of weeks ago.  His symptoms seems to have improved relative to the headache and a fever.  She continues to have a cough which is nonproductive.  Patient has some chest tightness but denies any fever or chills at this time.  Nasal discharge is clear.  No dizziness or near syncopal episodes.  Patient continues to have some fatigue but denies any shortness of breath orthopnea or paroxysmal nocturnal dyspnea.  No nausea, vomiting or diarrhea.   HPI  Past Medical History:  Diagnosis Date  . Anxiety   . Depression   . Takotsubo cardiomyopathy     Patient Active Problem List   Diagnosis Date Noted  . DEPRESSION 05/16/2009    Past Surgical History:  Procedure Laterality Date  . CARDIAC SURGERY    . DILATION AND CURETTAGE OF UTERUS     1996    OB History   No obstetric history on file.      Home Medications    Prior to Admission medications   Medication Sig Start Date End Date Taking? Authorizing Provider  benzonatate (TESSALON) 100 MG capsule Take 1 capsule (100 mg total) by mouth 3 (three) times daily as needed for cough. 01/07/21  Yes Lizabeth Fellner, Britta Mccreedy, MD  clonazePAM (KLONOPIN) 0.5 MG tablet Take 0.5 mg by mouth 2 (two) times daily as needed for anxiety.   Yes [provider]  escitalopram (LEXAPRO) 10 MG tablet Take 5 mg by mouth daily.    Yes [provider]  fluticasone (FLONASE) 50 MCG/ACT nasal spray Place 1 spray into both nostrils daily. 01/07/21  Yes Bethannie Iglehart, Britta Mccreedy, MD  lisinopril (PRINIVIL,ZESTRIL) 10 MG tablet Take 10 mg  by mouth daily.   Yes [provider]  metoprolol tartrate (LOPRESSOR) 25 MG tablet Take 25 mg by mouth 2 (two) times daily.   Yes [provider]  progesterone (ENDOMETRIN) 100 MG vaginal insert Place 100 mg vaginally 2 (two) times daily.   Yes [provider]  albuterol (VENTOLIN HFA) 108 (90 Base) MCG/ACT inhaler Inhale 2 puffs into the lungs every 6 (six) hours as needed for wheezing or shortness of breath. 01/07/21   LampteyBritta Mccreedy, MD    Family History Family History  Problem Relation Age of Onset  . Cancer Mother        lung  . Hypertension Father   . Asthma Brother   . Heart failure Other     Social History Social History   Tobacco Use  . Smoking status: Never Smoker  . Smokeless tobacco: Never Used  Vaping Use  . Vaping Use: Never used  Substance Use Topics  . Alcohol use: Yes    Alcohol/week: 2.0 standard drinks    Types: 2 Glasses of wine per week  . Drug use: No     Allergies   Penicillins   Review of Systems Review of Systems  HENT: Positive for congestion and rhinorrhea.   Respiratory: Positive for cough, chest tightness and wheezing. Negative for shortness of breath.   Cardiovascular: Negative.  Gastrointestinal: Negative.   Neurological: Negative.  Negative for headaches.     Physical Exam Triage Vital Signs ED Triage Vitals  Enc Vitals Group     BP 01/07/21 1633 107/67     Pulse Rate 01/07/21 1633 66     Resp 01/07/21 1633 16     Temp 01/07/21 1633 98.3 F (36.8 C)     Temp Source 01/07/21 1633 Oral     SpO2 01/07/21 1633 99 %     Weight --      Height --      Head Circumference --      Peak Flow --      Pain Score 01/07/21 1635 5     Pain Loc --      Pain Edu? --      Excl. in GC? --    No data found.  Updated Vital Signs BP 107/67 (BP Location: Left Arm)   Pulse 66   Temp 98.3 F (36.8 C) (Oral)   Resp 16   LMP 03/29/2018   SpO2 99%   Visual Acuity Right Eye Distance:   Left Eye Distance:    Bilateral Distance:    Right Eye Near:   Left Eye Near:    Bilateral Near:     Physical Exam Vitals and nursing note reviewed.  Constitutional:      General: She is not in acute distress.    Appearance: She is not ill-appearing.  HENT:     Right Ear: Tympanic membrane normal.     Left Ear: Tympanic membrane normal.  Cardiovascular:     Rate and Rhythm: Normal rate and regular rhythm.     Pulses: Normal pulses.     Heart sounds: Normal heart sounds.  Pulmonary:     Effort: Pulmonary effort is normal.     Breath sounds: Normal breath sounds.  Abdominal:     General: Bowel sounds are normal.     Palpations: Abdomen is soft.  Neurological:     Mental Status: She is alert.      UC Treatments / Results  Labs (all labs ordered are listed, but only abnormal results are displayed) Labs Reviewed - No data to display  EKG   Radiology No results found.  Procedures Procedures (including critical care time)  Medications Ordered in UC Medications - No data to display  Initial Impression / Assessment and Plan / UC Course  I have reviewed the triage vital signs and the nursing notes.  Pertinent labs & imaging results that were available during my care of the patient were reviewed by me and considered in my medical decision making (see chart for details).     1.  Postviral cough syndrome: Chest x-ray is negative for acute lung infiltrate Tessalon Perles as needed for cough Albuterol inhaler as needed for chest tightness and shortness of breath Fluticasone nasal spray Return precautions given  Final Clinical Impressions(s) / UC Diagnoses   Final diagnoses:  Post-viral cough syndrome     Discharge Instructions     Please use medications as prescribed Increase oral fluid intake If you have worsening symptoms please return to the urgent care to be reevaluated Your chest x-ray is negative for lung infiltrate.    ED Prescriptions    Medication Sig Dispense  Auth. Provider   albuterol (VENTOLIN HFA) 108 (90 Base) MCG/ACT inhaler Inhale 2 puffs into the lungs every 6 (six) hours as needed for wheezing or shortness of breath. 18 g Philamena Kramar, Britta Mccreedy, MD  benzonatate (TESSALON) 100 MG capsule Take 1 capsule (100 mg total) by mouth 3 (three) times daily as needed for cough. 30 capsule Rayleen Wyrick, Britta Mccreedy, MD   fluticasone (FLONASE) 50 MCG/ACT nasal spray Place 1 spray into both nostrils daily. 16 g Cyriah Childrey, Britta Mccreedy, MD     PDMP not reviewed this encounter.   Merrilee Jansky, MD 01/07/21 1745

## 2021-01-07 NOTE — ED Triage Notes (Signed)
Patient states that she was diagnosed with COVID about 2 weeks ago.  Patient is still having a dry cough, headache, nasal congestion, afebrile.  Patient is vaccinated.

## 2021-01-07 NOTE — Discharge Instructions (Signed)
Please use medications as prescribed Increase oral fluid intake If you have worsening symptoms please return to the urgent care to be reevaluated Your chest x-ray is negative for l pneumonia.

## 2021-05-10 ENCOUNTER — Emergency Department (INDEPENDENT_AMBULATORY_CARE_PROVIDER_SITE_OTHER): Payer: 59

## 2021-05-10 ENCOUNTER — Emergency Department
Admission: EM | Admit: 2021-05-10 | Discharge: 2021-05-10 | Disposition: A | Discharge status: Home / Self Care | Payer: 59 | Source: Home / Self Care | Attending: Family Medicine | Admitting: Family Medicine

## 2021-05-10 ENCOUNTER — Encounter: Payer: Self-pay | Admitting: Emergency Medicine

## 2021-05-10 ENCOUNTER — Other Ambulatory Visit: Payer: Self-pay

## 2021-05-10 DIAGNOSIS — M461 Sacroiliitis, not elsewhere classified: Secondary | ICD-10-CM

## 2021-05-10 DIAGNOSIS — M25552 Pain in left hip: Secondary | ICD-10-CM

## 2021-05-10 MED ORDER — METHYLPREDNISOLONE 4 MG PO TBPK: ORAL_TABLET | ORAL | 0 refills | Status: DC

## 2021-05-10 NOTE — ED Provider Notes (Signed)
[facility]    CSN: [id number] Arrival date & time: 05/10/21  1055      History   Chief Complaint Chief Complaint  Patient presents with   Back Pain    HPI Kiara Hubbard is a 50 y.o. female.   HPI  Patient is here for back pain.  Its been bothering her off and on for a month or more.  She did not have any accident or injury.  No new activity.  She states usually when she gets back pain it is in the lower central region.  This time is localized to the left low back.  She mentioned to her family doctor at her physical exam in September.  She was given a prescription for cyclobenzaprine.  This has not helped.  She takes ibuprofen as needed pain.  She states that there are certain movements and activity she has been trying not to do because of her back pain.  Bending and twisting are painful for her.  No radiation of pain.  No numbness or weakness.  No bowel or bladder complaint  Past Medical History:  Diagnosis Date   Anxiety    Depression    Takotsubo cardiomyopathy     Patient Active Problem List   Diagnosis Date Noted   DEPRESSION 05/16/2009    Past Surgical History:  Procedure Laterality Date   CARDIAC SURGERY     DILATION AND CURETTAGE OF UTERUS     1996    OB History   No obstetric history on file.      Home Medications    Prior to Admission medications   Medication Sig Start Date End Date Taking? Authorizing Provider  albuterol (VENTOLIN HFA) 108 (90 Base) MCG/ACT inhaler Inhale 2 puffs into the lungs every 6 (six) hours as needed for wheezing or shortness of breath. 01/07/21  Yes Lamptey, Britta Mccreedy, MD  clonazePAM (KLONOPIN) 0.5 MG tablet Take 0.5 mg by mouth 2 (two) times daily as needed for anxiety.   Yes [provider]  cyclobenzaprine (FLEXERIL) 5 MG tablet Take by mouth. 04/16/21  Yes [provider]  escitalopram (LEXAPRO) 10 MG tablet Take 5 mg by mouth daily.    Yes [provider]  fluticasone (FLONASE) 50  MCG/ACT nasal spray Place 1 spray into both nostrils daily. 01/07/21  Yes Lamptey, Britta Mccreedy, MD  lisinopril (PRINIVIL,ZESTRIL) 10 MG tablet Take 10 mg by mouth daily.   Yes [provider]  methylPREDNISolone (MEDROL DOSEPAK) 4 MG TBPK tablet tad 05/10/21  Yes Eustace Moore, MD  metoprolol tartrate (LOPRESSOR) 25 MG tablet Take 25 mg by mouth 2 (two) times daily.   Yes [provider]  progesterone (ENDOMETRIN) 100 MG vaginal insert Place 100 mg vaginally 2 (two) times daily.   Yes [provider]    Family History Family History  Problem Relation Age of Onset   Cancer Mother        lung   Hypertension Father    Asthma Brother    Heart failure Other     Social History Social History   Tobacco Use   Smoking status: Never   Smokeless tobacco: Never  Vaping Use   Vaping Use: Never used  Substance Use Topics   Alcohol use: Yes    Alcohol/week: 2.0 standard drinks    Types: 2 Glasses of wine per week   Drug use: No     Allergies   Penicillins   Review of Systems Review of Systems  See HPI Physical Exam Triage Vital Signs ED Triage Vitals  Enc Vitals Group     BP 05/10/21 1125 109/71     Pulse Rate 05/10/21 1125 61     Resp 05/10/21 1125 16     Temp 05/10/21 1125 98.2 F (36.8 C)     Temp Source 05/10/21 1125 Oral     SpO2 05/10/21 1125 100 %     Weight --      Height --      Head Circumference --      Peak Flow --      Pain Score 05/10/21 1123 4     Pain Loc --      Pain Edu? --      Excl. in GC? --    No data found.  Updated Vital Signs BP 109/71 (BP Location: Right Arm)   Pulse 61   Temp 98.2 F (36.8 C) (Oral)   Resp 16   LMP 03/29/2018   SpO2 100%      Physical Exam Constitutional:      General: She is not in acute distress.    Appearance: She is well-developed and normal weight.  HENT:     Head: Normocephalic and atraumatic.     Mouth/Throat:     Comments: Mask is in place Eyes:     Conjunctiva/sclera:  Conjunctivae normal.     Pupils: Pupils are equal, round, and reactive to light.  Cardiovascular:     Rate and Rhythm: Normal rate.  Pulmonary:     Effort: Pulmonary effort is normal. No respiratory distress.  Abdominal:     General: There is no distension.     Palpations: Abdomen is soft.  Musculoskeletal:        General: Normal range of motion.     Cervical back: Normal range of motion.     Comments: There is tenderness over the left SI joint.  Localized.  No tenderness also over the bony structures of the back.  No palpable muscle spasm.  Slow but full range of motion.  Strength sensation range of motion reflexes are intact in both lower extremities.  Straight leg raise is negative bilaterally.  Skin:    General: Skin is warm and dry.  Neurological:     Mental Status: She is alert.  Psychiatric:        Mood and Affect: Mood normal.        Behavior: Behavior normal.     UC Treatments / Results  Labs (all labs ordered are listed, but only abnormal results are displayed) Labs Reviewed - No data to display  EKG   Radiology DG Lumbar Spine Complete  Result Date: 05/10/2021 CLINICAL DATA:  Pain of left sacroiliac joint. EXAM: LUMBAR SPINE - COMPLETE 4+ VIEW COMPARISON:  None. FINDINGS: There is no evidence of lumbar spine fracture. Alignment is normal. Intervertebral disc spaces are maintained. IMPRESSION: Negative. Electronically Signed   By: Sherian Rein M.D.   On: 05/10/2021 12:29    Procedures Procedures (including critical care time)  Medications Ordered in UC Medications - No data to display  Initial Impression / Assessment and Plan / UC Course  I have reviewed the triage vital signs and the nursing notes.  Pertinent labs & imaging results that were available during my care of the patient were reviewed by me and considered in my medical decision making (see chart for details).     Patient has some SI inflammation.  Unknown cause.  We will treat with  anti-inflammatories and ice.  Recommend sports medicine if she fails to improve Final Clinical Impressions(s) / UC Diagnoses   Final diagnoses:  Sacroiliac inflammation (HCC)     Discharge Instructions      Use ice to painful back area for 20 minutes every few hours Take the Medrol pack as directed.  Take all of day 1 today When you have completed the Medrol pack take Aleve 2 pills in the morning and then 2 pills at night with food See your family doctor or sports medicine consultant if you fail to improve   ED Prescriptions     Medication Sig Dispense Auth. Provider   methylPREDNISolone (MEDROL DOSEPAK) 4 MG TBPK tablet tad 21 tablet Eustace Moore, MD      PDMP not reviewed this encounter.   Eustace Moore, MD 05/10/21 1335

## 2021-05-10 NOTE — Discharge Instructions (Signed)
Use ice to painful back area for 20 minutes every few hours Take the Medrol pack as directed.  Take all of day 1 today When you have completed the Medrol pack take Aleve 2 pills in the morning and then 2 pills at night with food See your family doctor or sports medicine consultant if you fail to improve

## 2021-05-10 NOTE — ED Triage Notes (Signed)
Patient presents to Urgent Care with complaints of left lower back pain since 1 month ago. Patient reports having back pain before but this is different. Pain is located on left lower back. Some pain radiating down into the buttocks. PCP prescribed muscle relaxer hasn't help much. Taking Motrin for pain during the day

## 2021-12-27 IMAGING — DX DG CHEST 2V
2 series · 2 of 2 positions shown · non-contrast
Comparison: 03/03/2013

CLINICAL DATA: Shortness of breath, chest tightness

EXAM:
CHEST - 2 VIEW

[chest pa]
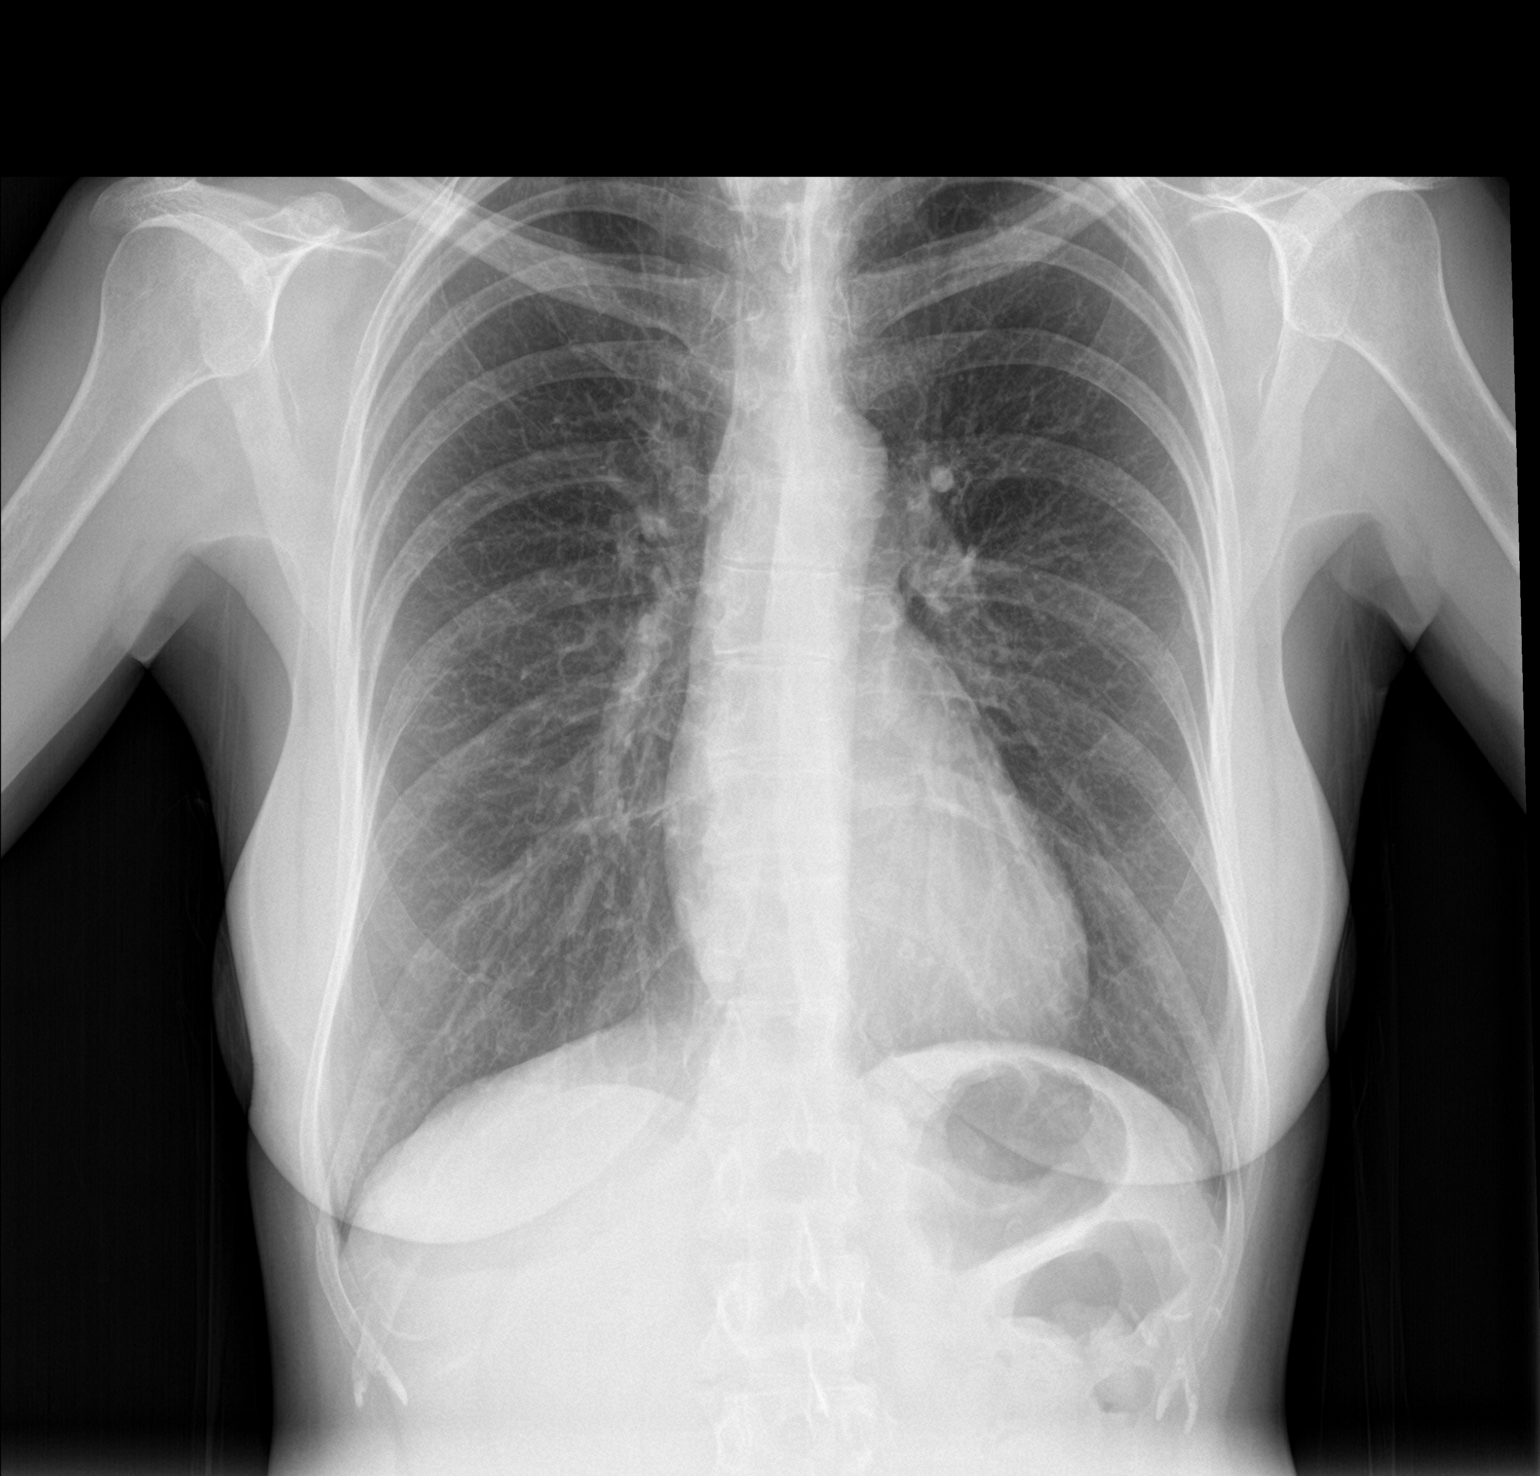

[chest lat]
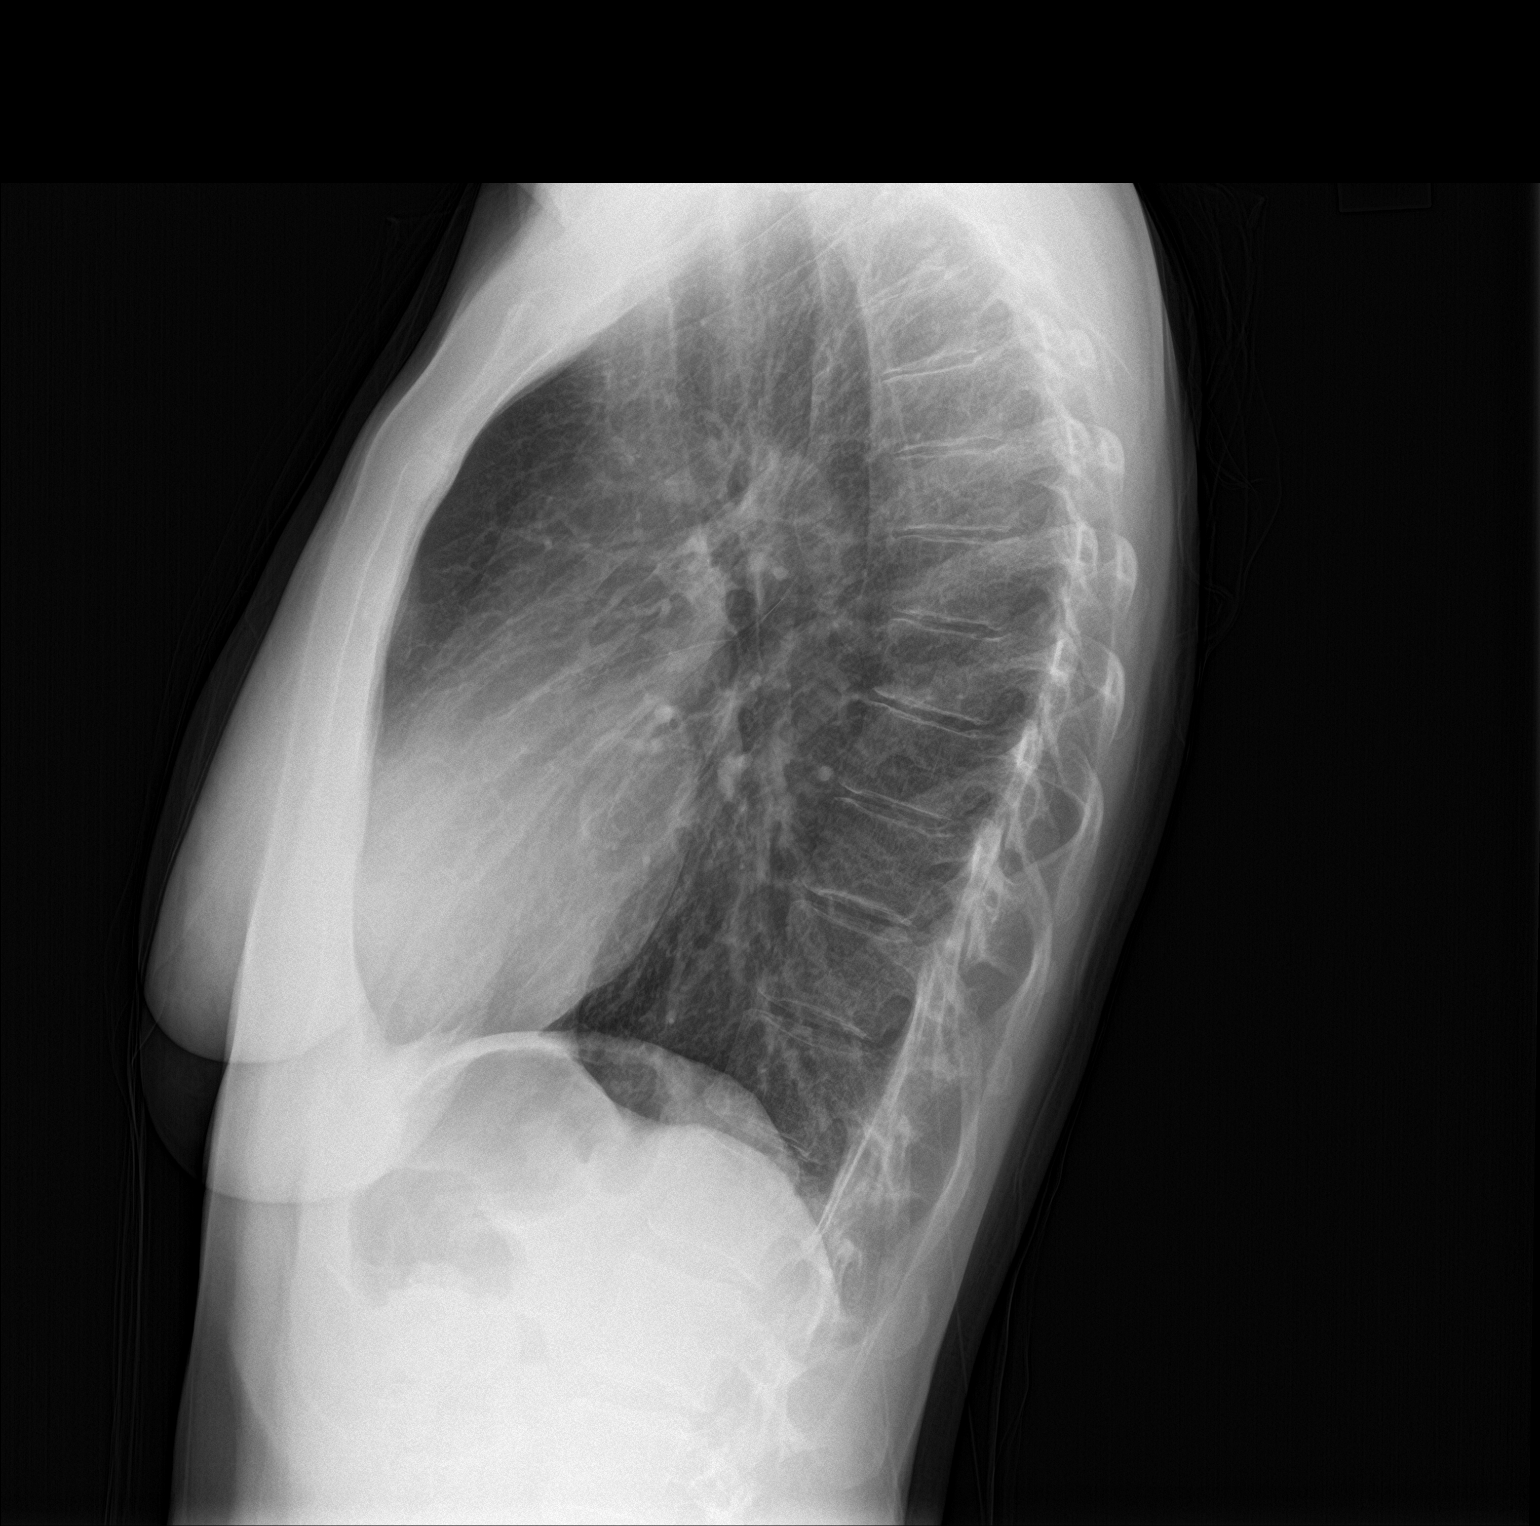

[2 of 2 positions shown; findings below may reference images not displayed]

FINDINGS: The heart size and mediastinal contours are within normal limits.
Both lungs are clear. The visualized skeletal structures are
unremarkable.
IMPRESSION: Negative.

## 2022-04-27 ENCOUNTER — Ambulatory Visit
Admission: EM | Admit: 2022-04-27 | Discharge: 2022-04-27 | Disposition: A | Discharge status: Home / Self Care | Payer: 59 | Attending: Family Medicine | Admitting: Family Medicine

## 2022-04-27 DIAGNOSIS — R1084 Generalized abdominal pain: Secondary | ICD-10-CM | POA: Diagnosis not present

## 2022-04-27 DIAGNOSIS — R3129 Other microscopic hematuria: Secondary | ICD-10-CM | POA: Diagnosis not present

## 2022-04-27 DIAGNOSIS — N390 Urinary tract infection, site not specified: Secondary | ICD-10-CM

## 2022-04-27 DIAGNOSIS — R109 Unspecified abdominal pain: Secondary | ICD-10-CM | POA: Diagnosis present

## 2022-04-27 LAB — POCT URINALYSIS DIP (MANUAL ENTRY)
Bilirubin, UA: NEGATIVE
Glucose, UA: NEGATIVE mg/dL
Ketones, POC UA: NEGATIVE mg/dL
Leukocytes, UA: NEGATIVE
Nitrite, UA: NEGATIVE
Protein Ur, POC: NEGATIVE mg/dL
Spec Grav, UA: 1.015 (ref 1.010–1.025)
Urobilinogen, UA: 0.2 E.U./dL
pH, UA: 7 (ref 5.0–8.0)

## 2022-04-27 MED ORDER — CIPROFLOXACIN HCL 500 MG PO TABS: 500.0000 mg | ORAL_TABLET | Freq: Two times a day (BID) | ORAL | 0 refills | Status: AC

## 2022-04-27 NOTE — ED Provider Notes (Signed)
[facility]    CSN: [id number] Arrival date & time: 04/27/22  1619      History   Chief Complaint Chief Complaint  Patient presents with   Abdominal Pain    HPI Kiara Hubbard is a 51 y.o. female.   HPI  Patient states that she has had some suprapubic pressure for a couple of days.  When she sits she feels like she is bloated.  She states that she is postmenopausal.  No menstrual periods.  No vaginal symptoms.  She has normal bowel movements.  No nausea or vomiting although she states her appetite is slightly diminished today.  She states that she does feel like she is urinating more frequently.  She does have hematuria on today's examination.  No history of kidney stones or kidney infections.  Normal colonoscopy in 2020 with no diverticular disease or known colon disorder.  Past Medical History:  Diagnosis Date   Anxiety    Depression    Takotsubo cardiomyopathy     Patient Active Problem List   Diagnosis Date Noted   DEPRESSION 05/16/2009    Past Surgical History:  Procedure Laterality Date   CARDIAC SURGERY     DILATION AND CURETTAGE OF UTERUS     1996    OB History   No obstetric history on file.      Home Medications    Prior to Admission medications   Medication Sig Start Date End Date Taking? Authorizing Provider  ciprofloxacin (CIPRO) 500 MG tablet Take 1 tablet (500 mg total) by mouth every 12 (twelve) hours. 04/27/22  Yes Eustace Moore, MD  albuterol (VENTOLIN HFA) 108 (90 Base) MCG/ACT inhaler Inhale 2 puffs into the lungs every 6 (six) hours as needed for wheezing or shortness of breath. 01/07/21   Lamptey, Britta Mccreedy, MD  clonazePAM (KLONOPIN) 0.5 MG tablet Take 0.5 mg by mouth 2 (two) times daily as needed for anxiety.    [provider]  cyclobenzaprine (FLEXERIL) 5 MG tablet Take by mouth. 04/16/21   [provider]  escitalopram (LEXAPRO) 10 MG tablet Take 5 mg by mouth daily.     [provider]   fluticasone (FLONASE) 50 MCG/ACT nasal spray Place 1 spray into both nostrils daily. 01/07/21   Merrilee Jansky, MD  lisinopril (PRINIVIL,ZESTRIL) 10 MG tablet Take 10 mg by mouth daily.    [provider]  metoprolol tartrate (LOPRESSOR) 25 MG tablet Take 25 mg by mouth 2 (two) times daily.    [provider]  progesterone (ENDOMETRIN) 100 MG vaginal insert Place 100 mg vaginally 2 (two) times daily.    [provider]    Family History Family History  Problem Relation Age of Onset   Cancer Mother        lung   Hypertension Father    Asthma Brother    Heart failure Other     Social History Social History   Tobacco Use   Smoking status: Never   Smokeless tobacco: Never  Vaping Use   Vaping Use: Never used  Substance Use Topics   Alcohol use: Yes    Alcohol/week: 2.0 standard drinks of alcohol    Types: 2 Glasses of wine per week   Drug use: No     Allergies   Penicillins   Review of Systems Review of Systems  See HPI Physical Exam Triage Vital Signs ED Triage Vitals  Enc Vitals Group     BP 04/27/22 1627 124/71  Pulse Rate 04/27/22 1627 (!) 59     Resp 04/27/22 1627 14     Temp 04/27/22 1627 98.5 F (36.9 C)     Temp Source 04/27/22 1627 Oral     SpO2 04/27/22 1627 99 %     Weight --      Height --      Head Circumference --      Peak Flow --      Pain Score 04/27/22 1626 4     Pain Loc --      Pain Edu? --      Excl. in GC? --    No data found.  Updated Vital Signs BP 124/71 (BP Location: Right Arm)   Pulse (!) 59   Temp 98.5 F (36.9 C) (Oral)   Resp 14   LMP 03/29/2018   SpO2 99%      Physical Exam Constitutional:      General: She is not in acute distress.    Appearance: She is well-developed.  HENT:     Head: Normocephalic and atraumatic.  Eyes:     Conjunctiva/sclera: Conjunctivae normal.     Pupils: Pupils are equal, round, and reactive to light.  Cardiovascular:     Rate and Rhythm: Normal rate.   Pulmonary:     Effort: Pulmonary effort is normal. No respiratory distress.  Abdominal:     General: Bowel sounds are increased. There is no distension.     Palpations: Abdomen is soft. There is no hepatomegaly, splenomegaly or mass.     Tenderness: There is abdominal tenderness.     Comments: Mild tenderness generalized across the lower abdominal area.  No mass.  No guarding or rebound.  Musculoskeletal:        General: Normal range of motion.     Cervical back: Normal range of motion.  Skin:    General: Skin is warm and dry.  Neurological:     General: No focal deficit present.     Mental Status: She is alert.  Psychiatric:        Mood and Affect: Mood normal.        Behavior: Behavior normal.      UC Treatments / Results  Labs (all labs ordered are listed, but only abnormal results are displayed) Labs Reviewed  POCT URINALYSIS DIP (MANUAL ENTRY) - Abnormal; Notable for the following components:      Result Value   Blood, UA small (*)    All other components within normal limits  URINE CULTURE    EKG   Radiology No results found.  Procedures Procedures (including critical care time)  Medications Ordered in UC Medications - No data to display  Initial Impression / Assessment and Plan / UC Course  I have reviewed the triage vital signs and the nursing notes.  Pertinent labs & imaging results that were available during my care of the patient were reviewed by me and considered in my medical decision making (see chart for details).     With hematuria suspect she may have a urinary infectious disease.  No bowel symptoms or findings noted.  Postmenopausal.  No epigastric or GI dyspepsia Final Clinical Impressions(s) / UC Diagnoses   Final diagnoses:  Generalized abdominal pain  Microscopic hematuria  Lower urinary tract infectious disease     Discharge Instructions      I will send your urine for culture Take antibiotic 2 times a day Increase water  intake  Your urine culture will be available  in 2-3 days Follow up with PCP    ED Prescriptions     Medication Sig Dispense Auth. Provider   ciprofloxacin (CIPRO) 500 MG tablet Take 1 tablet (500 mg total) by mouth every 12 (twelve) hours. 10 tablet Raylene Everts, MD      PDMP not reviewed this encounter.   Raylene Everts, MD 04/27/22 [id number]

## 2022-04-27 NOTE — Discharge Instructions (Addendum)
I will send your urine for culture Take antibiotic 2 times a day Increase water intake  Your urine culture will be available in 2-3 days Follow up with PCP

## 2022-04-27 NOTE — ED Triage Notes (Signed)
Pt presents with c/o abdominal pain x 2 days.

## 2022-04-28 ENCOUNTER — Telehealth: Payer: Self-pay

## 2022-04-28 NOTE — Telephone Encounter (Signed)
Called pt to check on status since UC visit yesterday. [facility] if any questions or concerns.

## 2022-04-29 LAB — URINE CULTURE

## 2022-04-29 IMAGING — DX DG LUMBAR SPINE COMPLETE 4+V
5 series · 5 of 5 positions shown · non-contrast
Comparison: None.

CLINICAL DATA: Pain of left sacroiliac joint.

EXAM:
LUMBAR SPINE - COMPLETE 4+ VIEW

[l-spine ap]
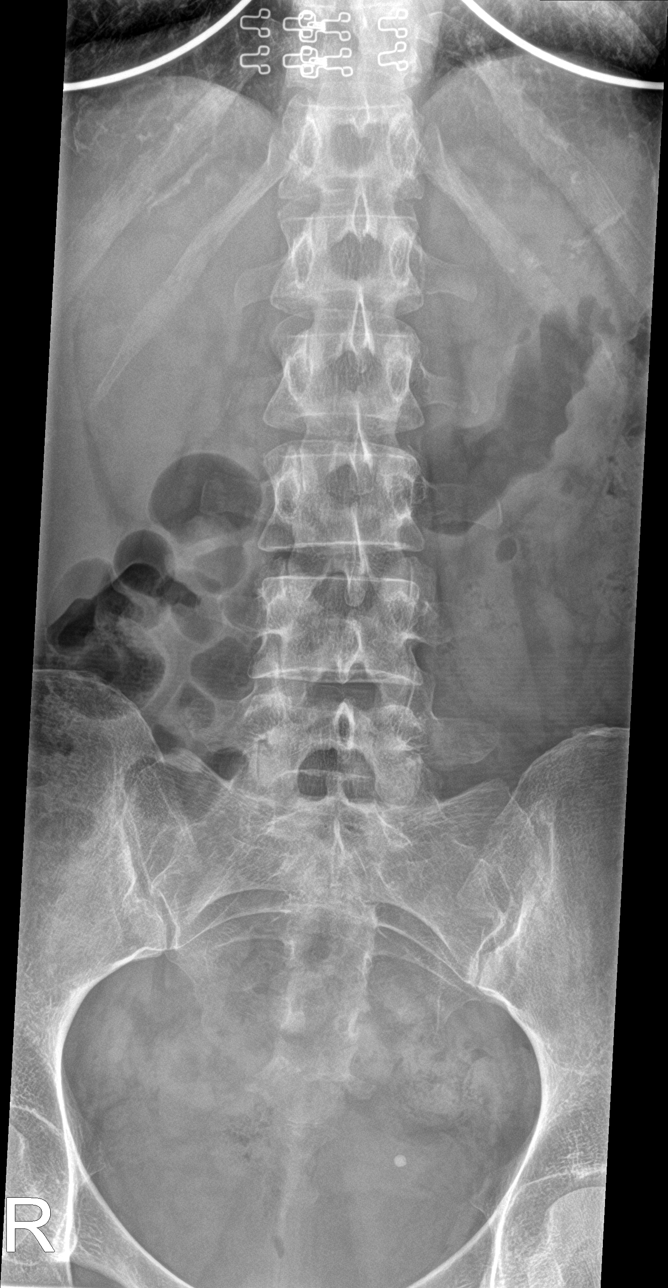

[l-spine obl (1 of 2)]
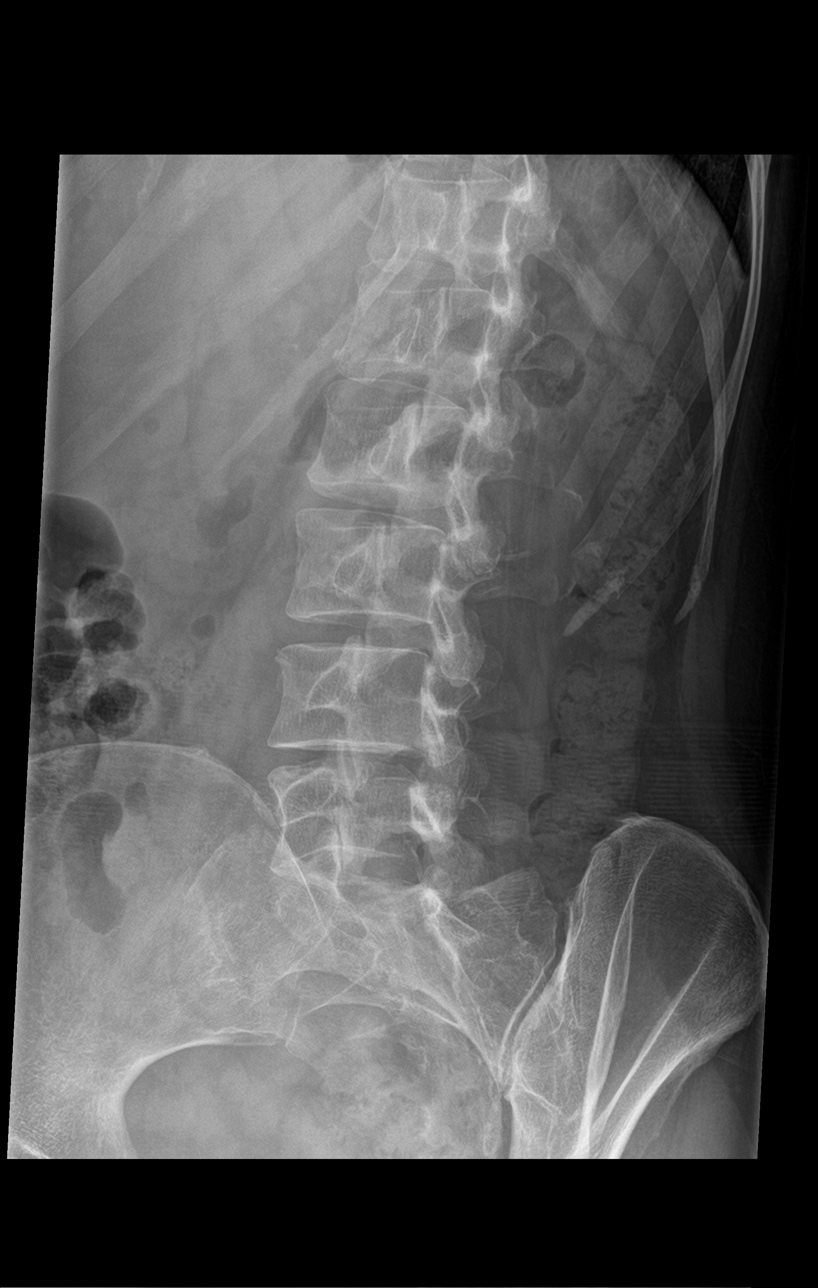

[l-spine obl (2 of 2)]
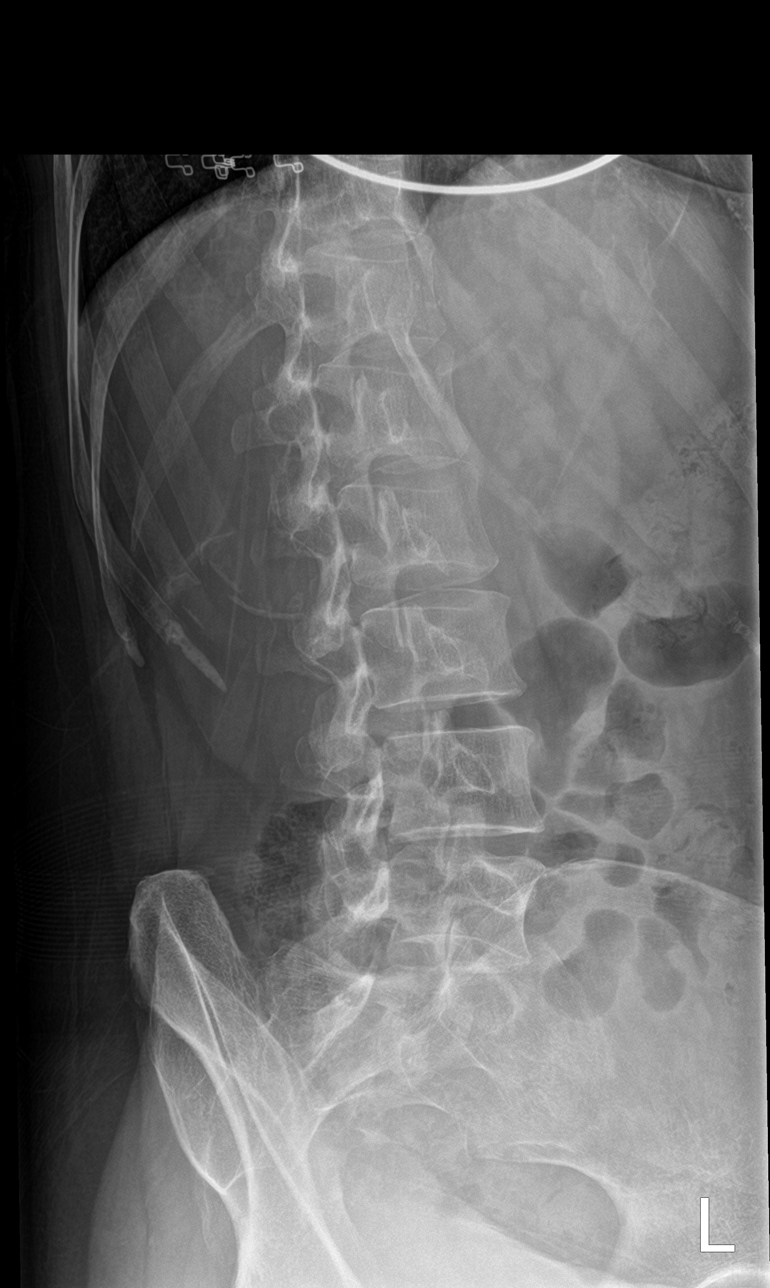

[l-spine lat]
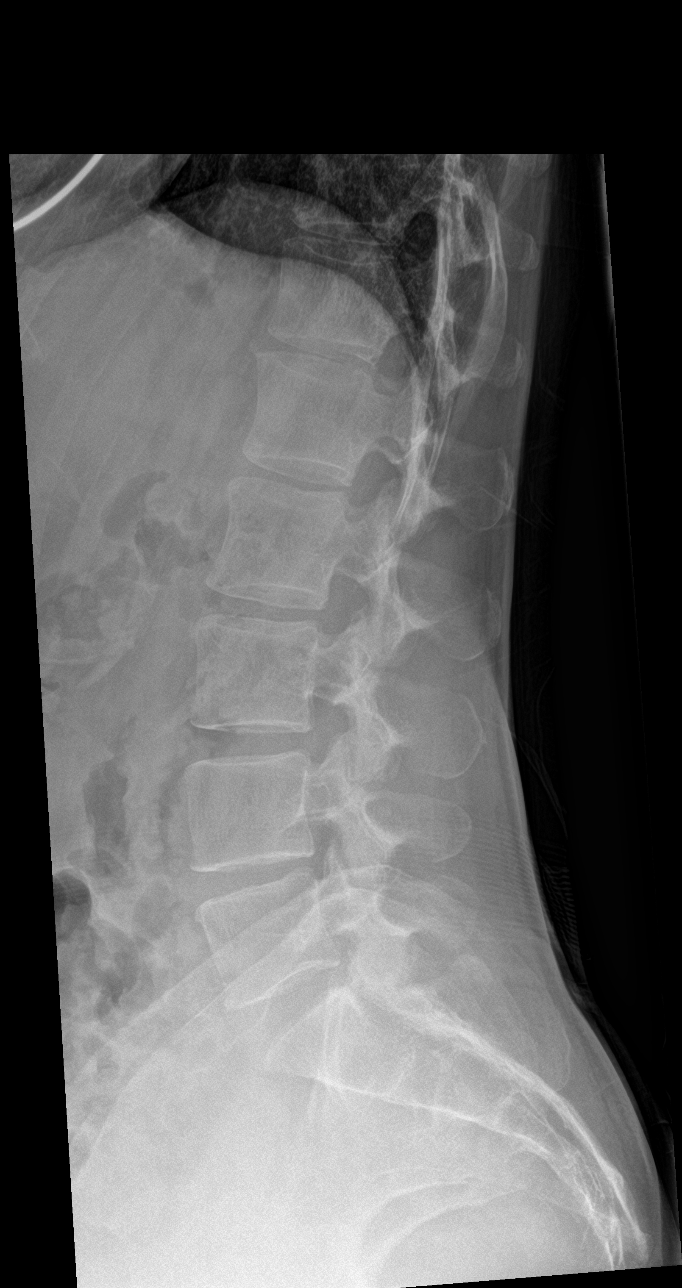

[l-spine spot]
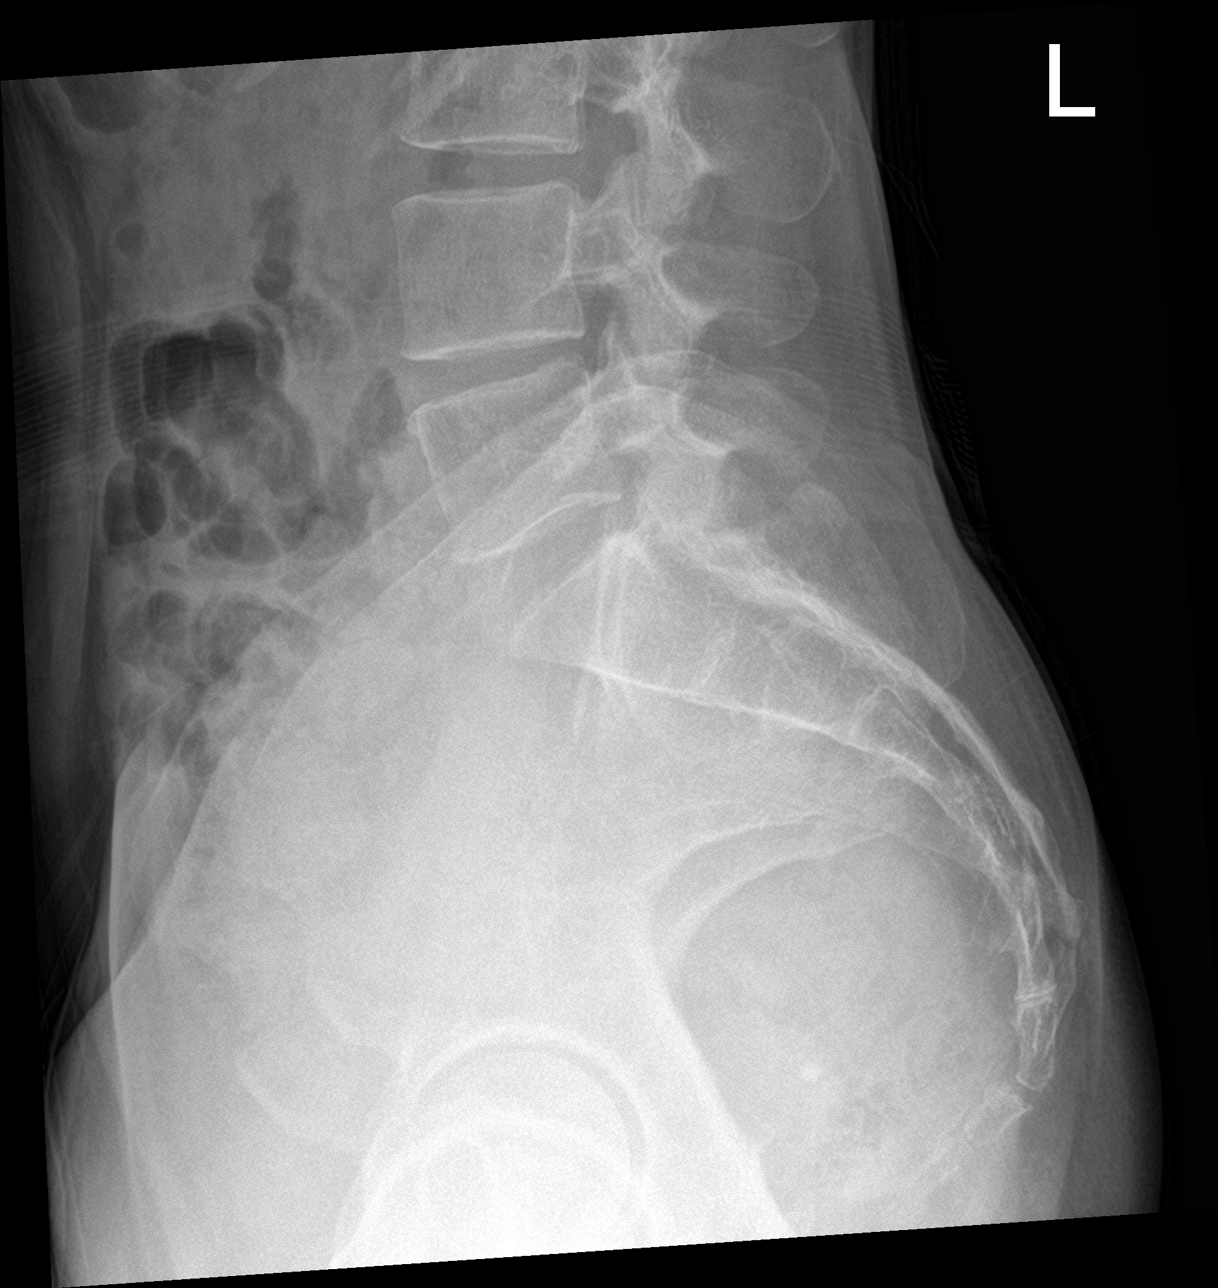

[5 of 5 positions shown; findings below may reference images not displayed]

FINDINGS: There is no evidence of lumbar spine fracture. Alignment is normal.
Intervertebral disc spaces are maintained.
IMPRESSION: Negative.
